# Patient Record
Sex: Female | Born: 1955 | Race: White | Hispanic: No | Marital: Married | Smoking: Current every day smoker
Health system: Southern US, Community
[De-identification: ages and names within clinical notes are randomized; demographics above are authoritative.]

## PROBLEM LIST (undated history)

## (undated) DIAGNOSIS — F419 Anxiety disorder, unspecified: Secondary | ICD-10-CM

## (undated) DIAGNOSIS — F329 Major depressive disorder, single episode, unspecified: Secondary | ICD-10-CM

## (undated) DIAGNOSIS — B1920 Unspecified viral hepatitis C without hepatic coma: Secondary | ICD-10-CM

## (undated) DIAGNOSIS — F319 Bipolar disorder, unspecified: Secondary | ICD-10-CM

## (undated) DIAGNOSIS — F32A Depression, unspecified: Secondary | ICD-10-CM

## (undated) HISTORY — PX: FOOT SURGERY: SHX648

## (undated) HISTORY — PX: ABDOMINAL HYSTERECTOMY: SHX81

---

## 1999-04-01 ENCOUNTER — Encounter: Payer: Self-pay | Admitting: Emergency Medicine

## 1999-04-01 ENCOUNTER — Emergency Department (HOSPITAL_COMMUNITY): Admission: EM | Admit: 1999-04-01 | Discharge: 1999-04-01 | Payer: Self-pay | Admitting: Emergency Medicine

## 1999-05-24 ENCOUNTER — Other Ambulatory Visit: Admission: RE | Admit: 1999-05-24 | Discharge: 1999-05-24 | Payer: Self-pay | Admitting: Obstetrics & Gynecology

## 1999-09-06 ENCOUNTER — Encounter: Admission: RE | Admit: 1999-09-06 | Discharge: 1999-11-22 | Payer: Self-pay | Admitting: Anesthesiology

## 1999-11-22 ENCOUNTER — Encounter: Admission: RE | Admit: 1999-11-22 | Discharge: 2000-01-28 | Payer: Self-pay | Admitting: Anesthesiology

## 2000-06-30 ENCOUNTER — Other Ambulatory Visit: Admission: RE | Admit: 2000-06-30 | Discharge: 2000-06-30 | Payer: Self-pay | Admitting: Obstetrics & Gynecology

## 2000-09-18 ENCOUNTER — Emergency Department (HOSPITAL_COMMUNITY): Admission: EM | Admit: 2000-09-18 | Discharge: 2000-09-18 | Payer: Self-pay | Admitting: *Deleted

## 2000-10-14 ENCOUNTER — Ambulatory Visit (HOSPITAL_COMMUNITY): Admission: RE | Admit: 2000-10-14 | Discharge: 2000-10-14 | Payer: Self-pay | Admitting: Obstetrics & Gynecology

## 2000-10-14 ENCOUNTER — Encounter (INDEPENDENT_AMBULATORY_CARE_PROVIDER_SITE_OTHER): Payer: Self-pay

## 2001-08-06 ENCOUNTER — Encounter: Admission: RE | Admit: 2001-08-06 | Discharge: 2001-08-06 | Payer: Self-pay | Admitting: Internal Medicine

## 2001-08-06 ENCOUNTER — Encounter: Payer: Self-pay | Admitting: Internal Medicine

## 2001-09-25 ENCOUNTER — Other Ambulatory Visit: Admission: RE | Admit: 2001-09-25 | Discharge: 2001-09-25 | Payer: Self-pay | Admitting: Obstetrics & Gynecology

## 2002-07-17 ENCOUNTER — Inpatient Hospital Stay (HOSPITAL_COMMUNITY): Admission: EM | Admit: 2002-07-17 | Discharge: 2002-07-22 | Payer: Self-pay | Admitting: Psychiatry

## 2002-07-23 ENCOUNTER — Other Ambulatory Visit (HOSPITAL_COMMUNITY): Admission: RE | Admit: 2002-07-23 | Discharge: 2002-08-02 | Payer: Self-pay | Admitting: Psychiatry

## 2003-04-11 ENCOUNTER — Encounter: Payer: Self-pay | Admitting: Emergency Medicine

## 2003-04-11 ENCOUNTER — Observation Stay (HOSPITAL_COMMUNITY): Admission: EM | Admit: 2003-04-11 | Discharge: 2003-04-12 | Payer: Self-pay

## 2003-04-14 ENCOUNTER — Encounter: Payer: Self-pay | Admitting: General Surgery

## 2003-04-14 ENCOUNTER — Inpatient Hospital Stay (HOSPITAL_COMMUNITY): Admission: EM | Admit: 2003-04-14 | Discharge: 2003-04-15 | Payer: Self-pay | Admitting: Emergency Medicine

## 2003-10-09 ENCOUNTER — Encounter: Admission: RE | Admit: 2003-10-09 | Discharge: 2003-11-04 | Payer: Self-pay | Admitting: Internal Medicine

## 2005-12-20 ENCOUNTER — Other Ambulatory Visit: Admission: RE | Admit: 2005-12-20 | Discharge: 2005-12-20 | Payer: Self-pay | Admitting: Obstetrics & Gynecology

## 2005-12-23 ENCOUNTER — Ambulatory Visit (HOSPITAL_COMMUNITY): Admission: RE | Admit: 2005-12-23 | Discharge: 2005-12-23 | Payer: Self-pay | Admitting: Obstetrics & Gynecology

## 2006-07-21 ENCOUNTER — Inpatient Hospital Stay (HOSPITAL_COMMUNITY): Admission: AD | Admit: 2006-07-21 | Discharge: 2006-07-25 | Payer: Self-pay | Admitting: Psychiatry

## 2006-07-21 ENCOUNTER — Ambulatory Visit: Payer: Self-pay | Admitting: Psychiatry

## 2006-12-05 ENCOUNTER — Encounter: Admission: RE | Admit: 2006-12-05 | Discharge: 2006-12-05 | Payer: Self-pay | Admitting: Obstetrics & Gynecology

## 2007-04-11 ENCOUNTER — Emergency Department (HOSPITAL_COMMUNITY): Admission: EM | Admit: 2007-04-11 | Discharge: 2007-04-11 | Payer: Self-pay | Admitting: Emergency Medicine

## 2007-05-16 ENCOUNTER — Encounter: Admission: RE | Admit: 2007-05-16 | Discharge: 2007-05-16 | Payer: Self-pay | Admitting: Gastroenterology

## 2007-05-18 ENCOUNTER — Encounter: Admission: RE | Admit: 2007-05-18 | Discharge: 2007-05-18 | Payer: Self-pay | Admitting: Gastroenterology

## 2007-09-27 ENCOUNTER — Ambulatory Visit: Payer: Self-pay | Admitting: Gastroenterology

## 2007-10-23 ENCOUNTER — Encounter: Admission: RE | Admit: 2007-10-23 | Discharge: 2007-10-23 | Payer: Self-pay | Admitting: Cardiology

## 2007-10-24 ENCOUNTER — Ambulatory Visit: Payer: Self-pay | Admitting: Oncology

## 2007-11-20 LAB — CBC WITH DIFFERENTIAL/PLATELET
BASO%: 0.6 % (ref 0.0–2.0)
Basophils Absolute: 0 10*3/uL (ref 0.0–0.1)
EOS%: 1.1 % (ref 0.0–7.0)
Eosinophils Absolute: 0.1 10*3/uL (ref 0.0–0.5)
HCT: 47.9 % — ABNORMAL HIGH (ref 34.8–46.6)
HGB: 16.7 g/dL — ABNORMAL HIGH (ref 11.6–15.9)
LYMPH%: 34.8 % (ref 14.0–48.0)
MCH: 31.4 pg (ref 26.0–34.0)
MCHC: 34.9 g/dL (ref 32.0–36.0)
MCV: 90 fL (ref 81.0–101.0)
MONO#: 0.5 10*3/uL (ref 0.1–0.9)
MONO%: 8.5 % (ref 0.0–13.0)
NEUT#: 3.3 10*3/uL (ref 1.5–6.5)
NEUT%: 55 % (ref 39.6–76.8)
Platelets: 262 10*3/uL (ref 145–400)
RBC: 5.32 10*6/uL (ref 3.70–5.32)
RDW: 12.8 % (ref 11.3–14.5)
WBC: 5.9 10*3/uL (ref 3.9–10.0)
lymph#: 2.1 10*3/uL (ref 0.9–3.3)

## 2007-11-20 LAB — CHCC SMEAR

## 2007-11-20 LAB — MORPHOLOGY
PLT EST: ADEQUATE
RBC Comments: NORMAL

## 2007-11-26 ENCOUNTER — Ambulatory Visit: Admission: RE | Admit: 2007-11-26 | Discharge: 2007-11-26 | Payer: Self-pay | Admitting: Oncology

## 2007-11-28 LAB — COMPREHENSIVE METABOLIC PANEL
ALT: 35 U/L (ref 0–35)
AST: 29 U/L (ref 0–37)
Albumin: 4.3 g/dL (ref 3.5–5.2)
Alkaline Phosphatase: 52 U/L (ref 39–117)
BUN: 8 mg/dL (ref 6–23)
CO2: 27 mEq/L (ref 19–32)
Calcium: 9.2 mg/dL (ref 8.4–10.5)
Chloride: 104 mEq/L (ref 96–112)
Creatinine, Ser: 0.59 mg/dL (ref 0.40–1.20)
Glucose, Bld: 95 mg/dL (ref 70–99)
Potassium: 3.6 mEq/L (ref 3.5–5.3)
Sodium: 141 mEq/L (ref 135–145)
Total Bilirubin: 0.4 mg/dL (ref 0.3–1.2)
Total Protein: 7.3 g/dL (ref 6.0–8.3)

## 2007-11-28 LAB — JAK2 GENOTYPR

## 2007-11-28 LAB — LACTATE DEHYDROGENASE: LDH: 116 U/L (ref 94–250)

## 2007-12-19 ENCOUNTER — Ambulatory Visit: Payer: Self-pay | Admitting: Oncology

## 2007-12-25 ENCOUNTER — Ambulatory Visit: Payer: Self-pay | Admitting: Gastroenterology

## 2007-12-25 LAB — CBC WITH DIFFERENTIAL/PLATELET
BASO%: 1.1 % (ref 0.0–2.0)
Basophils Absolute: 0.1 10*3/uL (ref 0.0–0.1)
EOS%: 0.5 % (ref 0.0–7.0)
Eosinophils Absolute: 0 10*3/uL (ref 0.0–0.5)
HCT: 46.5 % (ref 34.8–46.6)
HGB: 15.9 g/dL (ref 11.6–15.9)
LYMPH%: 42 % (ref 14.0–48.0)
MCH: 31.1 pg (ref 26.0–34.0)
MCHC: 34.2 g/dL (ref 32.0–36.0)
MCV: 90.8 fL (ref 81.0–101.0)
MONO#: 0.3 10*3/uL (ref 0.1–0.9)
MONO%: 5.7 % (ref 0.0–13.0)
NEUT#: 3 10*3/uL (ref 1.5–6.5)
NEUT%: 50.7 % (ref 39.6–76.8)
Platelets: 199 10*3/uL (ref 145–400)
RBC: 5.12 10*6/uL (ref 3.70–5.32)
RDW: 12.9 % (ref 11.3–14.5)
WBC: 5.9 10*3/uL (ref 3.9–10.0)
lymph#: 2.5 10*3/uL (ref 0.9–3.3)

## 2008-01-01 ENCOUNTER — Encounter (INDEPENDENT_AMBULATORY_CARE_PROVIDER_SITE_OTHER): Payer: Self-pay | Admitting: Interventional Radiology

## 2008-01-01 ENCOUNTER — Ambulatory Visit (HOSPITAL_COMMUNITY): Admission: RE | Admit: 2008-01-01 | Discharge: 2008-01-01 | Payer: Self-pay | Admitting: Gastroenterology

## 2008-03-20 ENCOUNTER — Ambulatory Visit: Payer: Self-pay | Admitting: Gastroenterology

## 2008-03-21 ENCOUNTER — Ambulatory Visit: Payer: Self-pay | Admitting: Oncology

## 2008-04-10 ENCOUNTER — Ambulatory Visit (HOSPITAL_BASED_OUTPATIENT_CLINIC_OR_DEPARTMENT_OTHER): Admission: RE | Admit: 2008-04-10 | Discharge: 2008-04-10 | Payer: Self-pay | Admitting: Family Medicine

## 2008-04-15 ENCOUNTER — Ambulatory Visit (HOSPITAL_COMMUNITY): Admission: RE | Admit: 2008-04-15 | Discharge: 2008-04-15 | Payer: Self-pay | Admitting: Family Medicine

## 2008-04-20 IMAGING — US US BIOPSY
1 series · 10 of 10 positions shown · non-contrast
Comparison: none

CLINICAL DATA: Hepatitis C; request is made for random core liver biopsy.
ULTRASOUND-GUIDED RANDOM CORE LIVER BIOPSY ? 01/01/08:

[Series 1: unknown · 0.28mm/px · 10 of 10 slices shown]
[im 1/10]
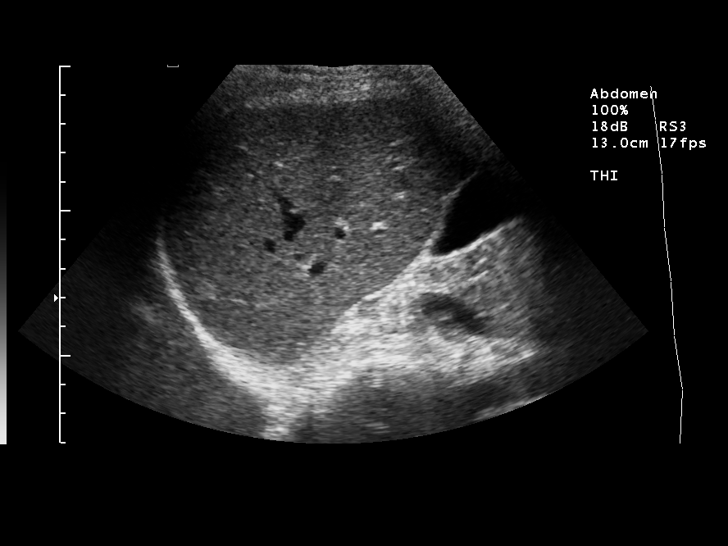
[im 2/10]
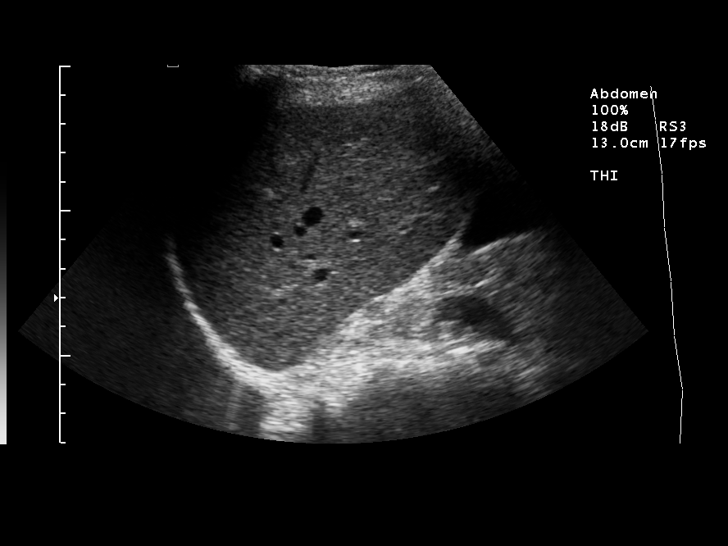
[im 3/10]
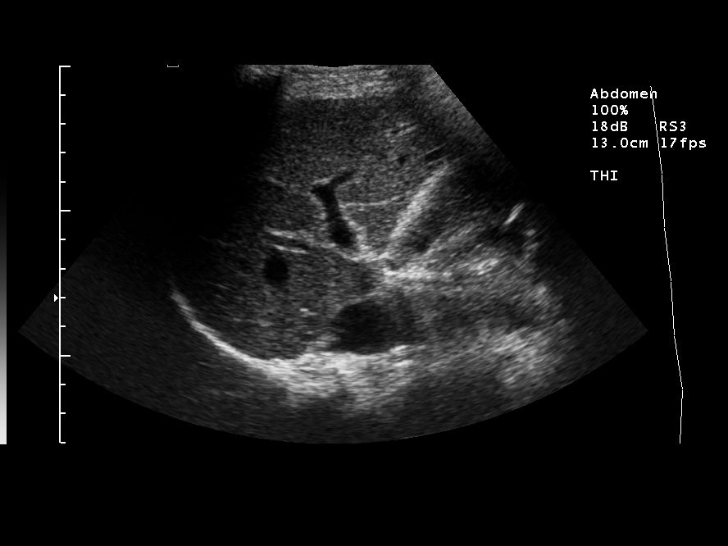
[im 4/10]
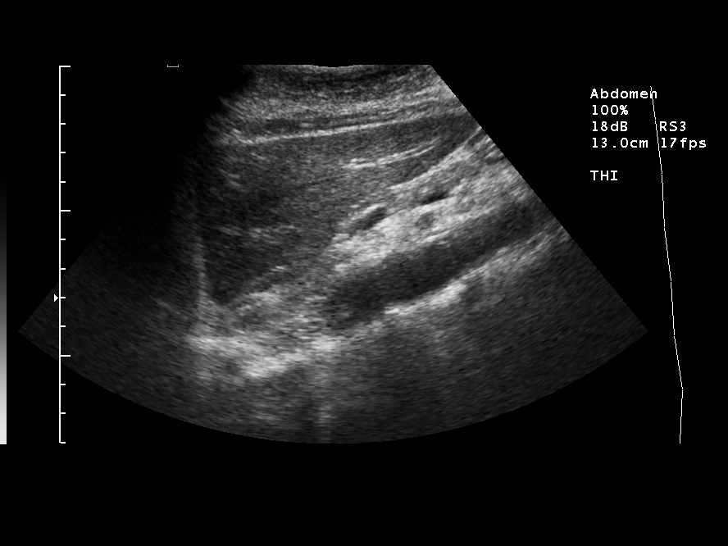
[im 5/10]
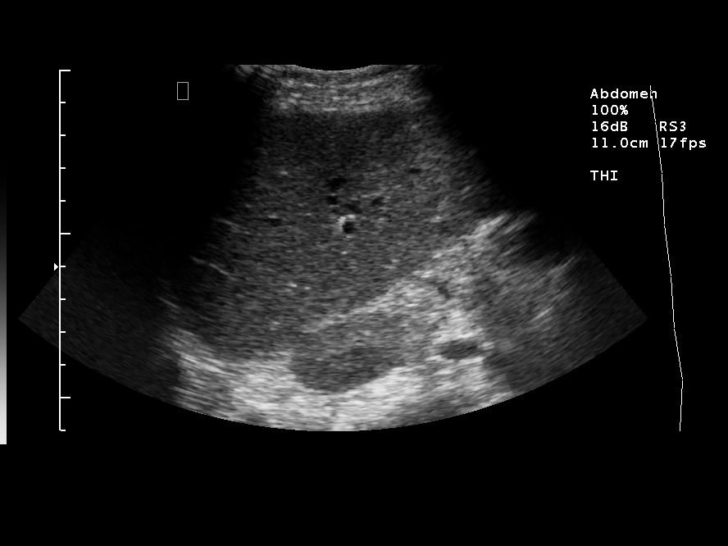
[im 6/10]
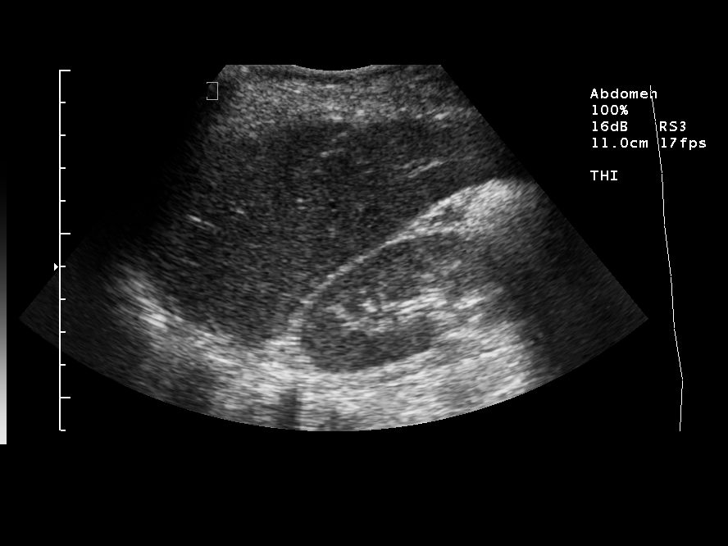
[im 7/10]
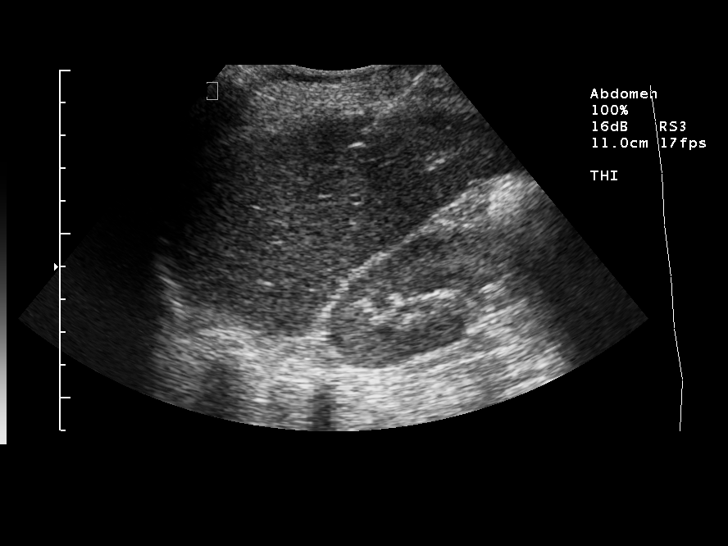
[im 8/10]
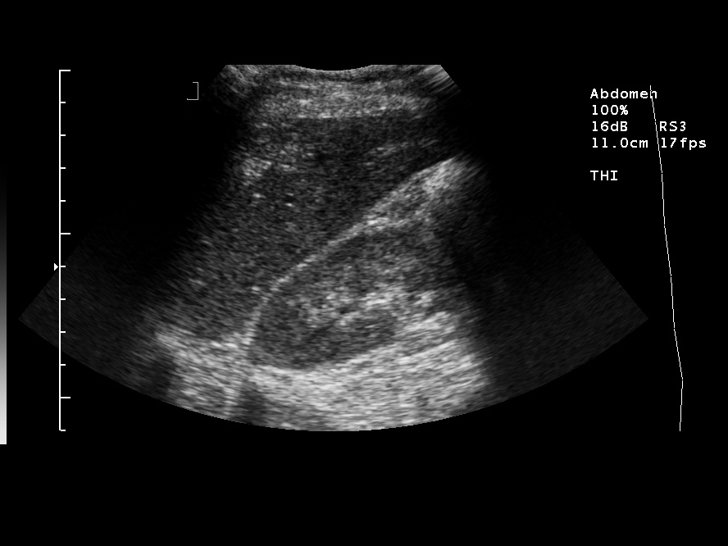
[im 9/10]
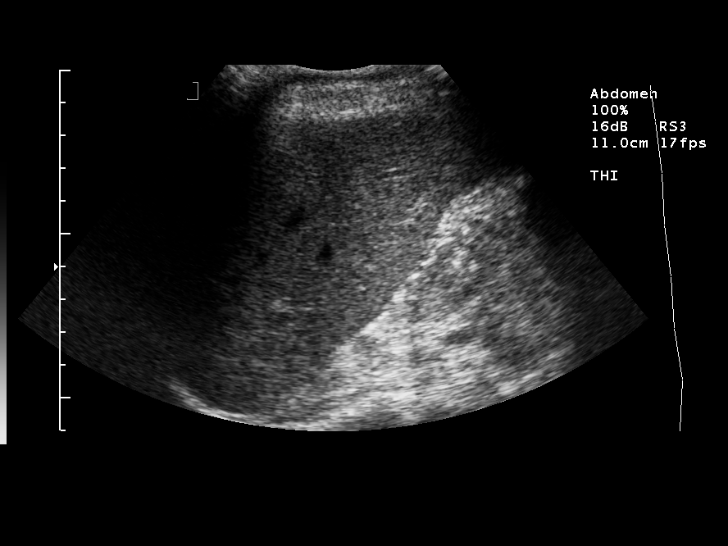
[im 10/10]
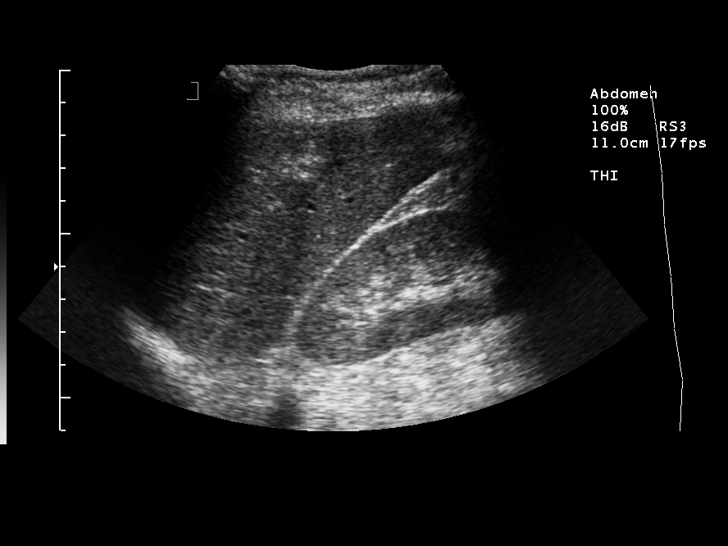

[10 of 10 positions shown; findings below may reference images not displayed]

FINDINGS: An ultrasound-guided random core liver biopsy was thoroughly discussed with the patient and questions were answered.  The benefits, risks, alternatives, and complications were also discussed.  The patient understands and wishes to proceed with the procedure.  Verbal as well as written consent was obtained.
Under ultrasound guidance an appropriate skin site was marked.  The patient was then prepped and draped in the normal sterile fashion.  1% lidocaine was used for local anesthesia.  Through a 17 gauge guiding trocar, three passes were then made into the right hepatic lobe with an 18 gauge biopsy gun.  Ultrasound imaging confirmed appropriate needle placement in the liver parenchyma.  Specimens were sent to pathology for further evaluation.  The patient tolerated the procedure well and there were no immediate complications.
Medications Utilized:  Versed 2 mg IV, fentanyl 100 mcg IV.
Total Time of Sedation: 15 minutes.
The procedure was performed under the supervision of Dr. Dariusz Board.
IMPRESSION: Successful ultrasound-guided random core liver biopsy of the right hepatic lobe as discussed above.

## 2008-06-12 ENCOUNTER — Ambulatory Visit: Payer: Self-pay | Admitting: Gastroenterology

## 2008-06-19 ENCOUNTER — Ambulatory Visit: Payer: Self-pay | Admitting: Gastroenterology

## 2008-06-26 ENCOUNTER — Ambulatory Visit: Payer: Self-pay | Admitting: Gastroenterology

## 2008-07-10 ENCOUNTER — Ambulatory Visit: Payer: Self-pay | Admitting: Gastroenterology

## 2008-08-05 ENCOUNTER — Ambulatory Visit: Payer: Self-pay | Admitting: Gastroenterology

## 2008-08-21 ENCOUNTER — Ambulatory Visit: Payer: Self-pay | Admitting: Gastroenterology

## 2008-09-02 ENCOUNTER — Ambulatory Visit: Payer: Self-pay | Admitting: Gastroenterology

## 2008-09-30 ENCOUNTER — Ambulatory Visit: Payer: Self-pay | Admitting: Gastroenterology

## 2008-11-04 ENCOUNTER — Ambulatory Visit: Payer: Self-pay | Admitting: Gastroenterology

## 2008-12-02 ENCOUNTER — Ambulatory Visit: Payer: Self-pay | Admitting: Gastroenterology

## 2008-12-30 ENCOUNTER — Ambulatory Visit: Payer: Self-pay | Admitting: Gastroenterology

## 2009-01-27 ENCOUNTER — Ambulatory Visit: Payer: Self-pay | Admitting: Gastroenterology

## 2009-03-03 ENCOUNTER — Ambulatory Visit: Payer: Self-pay | Admitting: Gastroenterology

## 2009-03-26 ENCOUNTER — Ambulatory Visit: Payer: Self-pay | Admitting: Gastroenterology

## 2009-04-23 ENCOUNTER — Ambulatory Visit: Payer: Self-pay | Admitting: Gastroenterology

## 2009-05-08 ENCOUNTER — Ambulatory Visit (HOSPITAL_COMMUNITY): Admission: RE | Admit: 2009-05-08 | Discharge: 2009-05-08 | Payer: Self-pay | Admitting: Gastroenterology

## 2009-05-19 ENCOUNTER — Ambulatory Visit (HOSPITAL_COMMUNITY): Admission: RE | Admit: 2009-05-19 | Discharge: 2009-05-19 | Payer: Self-pay | Admitting: Gastroenterology

## 2009-05-21 ENCOUNTER — Ambulatory Visit: Payer: Self-pay | Admitting: Gastroenterology

## 2009-11-11 ENCOUNTER — Ambulatory Visit: Payer: Self-pay | Admitting: Diagnostic Radiology

## 2009-11-11 ENCOUNTER — Emergency Department (HOSPITAL_BASED_OUTPATIENT_CLINIC_OR_DEPARTMENT_OTHER): Admission: EM | Admit: 2009-11-11 | Discharge: 2009-11-11 | Payer: Self-pay | Admitting: Emergency Medicine

## 2010-03-18 ENCOUNTER — Ambulatory Visit: Payer: Self-pay | Admitting: Gastroenterology

## 2011-01-23 LAB — POCT CARDIAC MARKERS
CKMB, poc: <1 ng/mL — ABNORMAL LOW (ref 1.0–8.0)
Myoglobin, poc: 27.4 ng/mL (ref 12–200)
Troponin i, poc: <0.05 ng/mL (ref 0.00–0.09)

## 2011-01-23 LAB — DIFFERENTIAL
Basophils Absolute: 0 10*3/uL (ref 0.0–0.1)
Basophils Relative: 1 % (ref 0–1)
Eosinophils Absolute: 0 10*3/uL (ref 0.0–0.7)
Eosinophils Relative: 1 % (ref 0–5)
Lymphocytes Relative: 38 % (ref 12–46)
Lymphs Abs: 2.2 10*3/uL (ref 0.7–4.0)
Monocytes Absolute: 0.4 10*3/uL (ref 0.1–1.0)
Monocytes Relative: 7 % (ref 3–12)
Neutro Abs: 3.3 10*3/uL (ref 1.7–7.7)
Neutrophils Relative %: 54 % (ref 43–77)

## 2011-01-23 LAB — CBC
HCT: 39.7 % (ref 36.0–46.0)
Hemoglobin: 13.7 g/dL (ref 12.0–15.0)
MCHC: 34.5 g/dL (ref 30.0–36.0)
MCV: 93.3 fL (ref 78.0–100.0)
Platelets: 291 10*3/uL (ref 150–400)
RBC: 4.26 MIL/uL (ref 3.87–5.11)
RDW: 12.5 % (ref 11.5–15.5)
WBC: 5.9 10*3/uL (ref 4.0–10.5)

## 2011-01-23 LAB — BASIC METABOLIC PANEL
BUN: 12 mg/dL (ref 6–23)
CO2: 29 mEq/L (ref 19–32)
Calcium: 9.2 mg/dL (ref 8.4–10.5)
Chloride: 106 mEq/L (ref 96–112)
Creatinine, Ser: 0.6 mg/dL (ref 0.4–1.2)
GFR calc Af Amer: >60 mL/min (ref >60)
GFR calc non Af Amer: >60 mL/min (ref >60)
Glucose, Bld: 119 mg/dL — ABNORMAL HIGH (ref 70–99)
Potassium: 3.7 mEq/L (ref 3.5–5.1)
Sodium: 142 mEq/L (ref 135–145)

## 2011-03-17 ENCOUNTER — Emergency Department (HOSPITAL_BASED_OUTPATIENT_CLINIC_OR_DEPARTMENT_OTHER)
Admission: EM | Admit: 2011-03-17 | Discharge: 2011-03-17 | Disposition: A | Discharge status: Home / Self Care | Payer: BC Managed Care – PPO | Attending: Emergency Medicine | Admitting: Emergency Medicine

## 2011-03-17 DIAGNOSIS — F411 Generalized anxiety disorder: Secondary | ICD-10-CM | POA: Insufficient documentation

## 2011-03-17 DIAGNOSIS — F988 Other specified behavioral and emotional disorders with onset usually occurring in childhood and adolescence: Secondary | ICD-10-CM | POA: Insufficient documentation
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
## 2011-03-25 NOTE — Op Note (Signed)
[facility]  Patient:    Brandi Vazquez, Brandi Vazquez                       MRN: [id number] Proc. Date: 10/14/00 Adm. Date:  [id number] Disc. Date: [id number] Attending:  Ephriam Knuckles H                           Operative Report  PREOPERATIVE DIAGNOSIS:  Persistent complex left adnexal mass, persistent pelvic pain.  POSTOPERATIVE DIAGNOSIS:  Persistent complex left adnexal mass, persistent pelvic pain, with complex left ovarian cyst most consistent with a hemorrhagic cyst without evidence of malignancy by inspection and extensive intraperitoneal adhesions.  OPERATIVE PROCEDURE:  Laparoscopy, lysis of adhesions, and left salpingo-oophorectomy.  SURGEON:  Freddy Finner, M.D.  ANESTHESIA:  General endotracheal.  INTRAOPERATIVE COMPLICATIONS:  None.  ESTIMATED INTRAOPERATIVE BLOOD LOSS:  Less than or equal to 20 cc.  INDICATIONS:  Patient is a 55 year old white female who has previously had a vaginal hysterectomy.  Since September of this year, she has had chronic left pelvic pain with recent marked exacerbation of her pain.  She has had persistence of her left ovarian cyst which is complex in nature and evolving with a change in recent ultrasound.  Due to the severe nature of her pain and the persistence of her symptom, she is now admitted for laparoscopy, possible bilateral salpingo-oophorectomy.  The patient and I carefully discussed the options and arrived at the understanding that, if the right ovary was normal, it would be left in place for now so that she would not have to start hormone replacement therapy.  She has reviewed a video in the office describing laparoscopy and the potential risks of the procedure and is ready to proceed.  Intraoperative findings are recorded in still photographs which were retained in the office record.  They consisted of extensive omental adhesions to structures in the pelvis including a right lower quadrant and pelvic  brim, anterior abdominal wall, right ovary, vaginal cuff.  The right ovary was normal with evidence of a recent corpus luteum.  The left ovary was enlarged and cystic but smooth and not fixed to any structures with no external excrescences or obvious abnormalities.  The appendix was normal.  There was no other apparent abnormality noted.  DESCRIPTION OF PROCEDURE:  Patient was admitted on the morning of surgery, brought to the operating room and, there, placed under adequate general endotracheal anesthesia and placed in the dorsal lithotomy position using the [facility] stirrup system.  Betadine prep was carried out.  Bladder was evacuated with sterile technique.  Sponge forceps loaded with sponges in the tip was placed in the vagina for potential use during the procedure.  Sterile drapes were applied.  Two small incisions were made, one at the umbilicus and one just above the symphysis.  A 12 mm disposable trocar was introduced at the umbilicus while elevating the anterior abdominal wall manually.  Direct inspection revealed adequate placement with no evidence of injury on entry. Pneumoperitoneum was allowed to accumulate with carbon dioxide gas.  Using bipolar coagulation and sharp division of the omental adhesions to the anterior abdominal wall and right lower quadrant and single, band-like adhesions in the right ovary were fulgurated and lysed.  Lysis of adhesions on the left was also carried out.  A 5 mm trocar was then placed through the lower incision and, through it, grasping forceps used to elevate the ovaries  to inspect and treat the right and to remove the left.  With progressive bites, bipolar fulguration and sharp division of the infundibulopelvic and remaining broad ligament was carried out on the left completely freeing the left tube and ovary.  The ovary was morcellated using sharp dissection and retrieved through the umbilical port.  A [place] irrigation system was used  to irrigate the pelvis and to confirm complete hemostasis.  The irrigating solution was aspirated from the abdomen and, with complete hemostasis, the procedure was terminated.  Gas was allowed to escape from the abdomen.  Plain Marcaine 0.25% was injected into the incision sites for postoperative analgesia.  The incisions were closed with interrupted subcuticular sutures of 3-0 Dexon.  Steri-Strips were applied to the lower incision.  The patient was awakened and taken to recovery in good condition.  The plan is to discharge her with routine outpatient surgical instructions, to avoid heavy lifting or vaginal entry for at least a week.  She has hydrocodone to be taken as needed for pain.  She is to return to the office in 10 to 14 days for postoperative follow-up. DD:  10/14/00 TD:  10/14/00 Job: [id number]
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
## 2011-07-29 LAB — BLOOD GAS, ARTERIAL
Acid-Base Excess: 1.8
Bicarbonate: 25.8 — ABNORMAL HIGH
Drawn by: 27931
FIO2: 0.21
O2 Saturation: 96.1
Patient temperature: 98.6
TCO2: 22.1
pCO2 arterial: 40
pH, Arterial: 7.425 — ABNORMAL HIGH
pO2, Arterial: 76.6 — ABNORMAL LOW

## 2011-07-29 LAB — CBC
HCT: 47 — ABNORMAL HIGH
Hemoglobin: 16 — ABNORMAL HIGH
MCHC: 34
MCV: 92.2
Platelets: 204
RBC: 5.09
RDW: 12.8
WBC: 5.5

## 2011-07-29 LAB — PROTIME-INR
INR: 1
Prothrombin Time: 13

## 2011-07-29 LAB — CARBOXYHEMOGLOBIN
Carboxyhemoglobin: 5.1
Methemoglobin: 1.2
O2 Saturation: 96.1
Total hemoglobin: 15.5

## 2011-08-25 LAB — I-STAT 8, (EC8 V) (CONVERTED LAB)
Acid-base deficit: 1
BUN: 10
Bicarbonate: 22.1
Chloride: 108
Glucose, Bld: 88
HCT: 51 — ABNORMAL HIGH
Hemoglobin: 17.3 — ABNORMAL HIGH
Operator id: 234501
Potassium: 3.7
Sodium: 140
TCO2: 23
pCO2, Ven: 31.5 — ABNORMAL LOW
pH, Ven: 7.453 — ABNORMAL HIGH

## 2011-08-25 LAB — HEPATIC FUNCTION PANEL
ALT: 97 — ABNORMAL HIGH
AST: 75 — ABNORMAL HIGH
Albumin: 3.9
Alkaline Phosphatase: 70
Bilirubin, Direct: 0.2
Indirect Bilirubin: 0.7
Total Bilirubin: 0.9
Total Protein: 7

## 2011-08-25 LAB — URINALYSIS, ROUTINE W REFLEX MICROSCOPIC
Bilirubin Urine: NEGATIVE
Glucose, UA: NEGATIVE
Hgb urine dipstick: NEGATIVE
Ketones, ur: NEGATIVE
Nitrite: POSITIVE — AB
Protein, ur: NEGATIVE
Specific Gravity, Urine: 1.022
Urobilinogen, UA: 1
pH: 6

## 2011-08-25 LAB — CBC
HCT: 47.4 — ABNORMAL HIGH
Hemoglobin: 16 — ABNORMAL HIGH
MCHC: 33.8
MCV: 91
Platelets: 248
RBC: 5.21 — ABNORMAL HIGH
RDW: 13.1
WBC: 11.5 — ABNORMAL HIGH

## 2011-08-25 LAB — DIFFERENTIAL
Basophils Absolute: 0.1
Basophils Relative: 1
Eosinophils Absolute: 0
Eosinophils Relative: 0
Lymphocytes Relative: 24
Lymphs Abs: 2.7
Monocytes Absolute: 0.8 — ABNORMAL HIGH
Monocytes Relative: 7
Neutro Abs: 7.9 — ABNORMAL HIGH
Neutrophils Relative %: 69

## 2011-08-25 LAB — URINE MICROSCOPIC-ADD ON

## 2011-08-25 LAB — URINE CULTURE: Colony Count: >=100000

## 2011-08-25 LAB — POCT I-STAT CREATININE
Creatinine, Ser: 0.7
Operator id: 234501

## 2011-08-25 LAB — LIPASE, BLOOD: Lipase: 36

## 2012-06-05 ENCOUNTER — Emergency Department (HOSPITAL_COMMUNITY)
Admission: EM | Admit: 2012-06-05 | Discharge: 2012-06-06 | Disposition: A | Discharge status: Home / Self Care | Payer: BC Managed Care – PPO | Attending: Emergency Medicine | Admitting: Emergency Medicine

## 2012-06-05 ENCOUNTER — Encounter (HOSPITAL_COMMUNITY): Payer: Self-pay | Admitting: *Deleted

## 2012-06-05 DIAGNOSIS — F341 Dysthymic disorder: Secondary | ICD-10-CM | POA: Insufficient documentation

## 2012-06-05 DIAGNOSIS — F329 Major depressive disorder, single episode, unspecified: Secondary | ICD-10-CM

## 2012-06-05 DIAGNOSIS — R45851 Suicidal ideations: Secondary | ICD-10-CM | POA: Insufficient documentation

## 2012-06-05 DIAGNOSIS — Z046 Encounter for general psychiatric examination, requested by authority: Secondary | ICD-10-CM | POA: Insufficient documentation

## 2012-06-05 DIAGNOSIS — F32A Depression, unspecified: Secondary | ICD-10-CM

## 2012-06-05 HISTORY — DX: Depression, unspecified: F32.A

## 2012-06-05 HISTORY — DX: Major depressive disorder, single episode, unspecified: F32.9

## 2012-06-05 HISTORY — DX: Anxiety disorder, unspecified: F41.9

## 2012-06-05 HISTORY — DX: Bipolar disorder, unspecified: F31.9

## 2012-06-05 LAB — CBC
HCT: 43 % (ref 36.0–46.0)
Hemoglobin: 14.5 g/dL (ref 12.0–15.0)
MCH: 31 pg (ref 26.0–34.0)
MCHC: 33.7 g/dL (ref 30.0–36.0)
MCV: 91.9 fL (ref 78.0–100.0)
Platelets: 204 10*3/uL (ref 150–400)
RBC: 4.68 MIL/uL (ref 3.87–5.11)
RDW: 12.6 % (ref 11.5–15.5)
WBC: 5.1 10*3/uL (ref 4.0–10.5)

## 2012-06-05 LAB — COMPREHENSIVE METABOLIC PANEL
ALT: 42 U/L — ABNORMAL HIGH (ref 0–35)
AST: 43 U/L — ABNORMAL HIGH (ref 0–37)
Albumin: 4 g/dL (ref 3.5–5.2)
Alkaline Phosphatase: 53 U/L (ref 39–117)
BUN: 12 mg/dL (ref 6–23)
CO2: 28 mEq/L (ref 19–32)
Calcium: 9.6 mg/dL (ref 8.4–10.5)
Chloride: 102 mEq/L (ref 96–112)
Creatinine, Ser: 0.87 mg/dL (ref 0.50–1.10)
GFR calc Af Amer: 86 mL/min — ABNORMAL LOW (ref >90)
GFR calc non Af Amer: 74 mL/min — ABNORMAL LOW (ref >90)
Glucose, Bld: 82 mg/dL (ref 70–99)
Potassium: 3.8 mEq/L (ref 3.5–5.1)
Sodium: 139 mEq/L (ref 135–145)
Total Bilirubin: 0.3 mg/dL (ref 0.3–1.2)
Total Protein: 6.9 g/dL (ref 6.0–8.3)

## 2012-06-05 LAB — RAPID URINE DRUG SCREEN, HOSP PERFORMED
Amphetamines: NOT DETECTED
Barbiturates: NOT DETECTED
Benzodiazepines: NOT DETECTED
Cocaine: NOT DETECTED
Opiates: NOT DETECTED
Tetrahydrocannabinol: POSITIVE — AB

## 2012-06-05 LAB — ETHANOL: Alcohol, Ethyl (B): <11 mg/dL (ref 0–11)

## 2012-06-05 MED ORDER — CLONAZEPAM 1 MG PO TABS
1.0000 mg | ORAL_TABLET | Freq: Two times a day (BID) | ORAL | Status: DC
  Administered 2012-06-05 – 2012-06-06 (×2): 1 mg via ORAL
  Filled 2012-06-05 (×2): qty 1

## 2012-06-05 MED ORDER — CITALOPRAM HYDROBROMIDE 40 MG PO TABS
40.0000 mg | ORAL_TABLET | Freq: Every day | ORAL | Status: DC
  Administered 2012-06-06: 40 mg via ORAL
  Filled 2012-06-05: qty 1

## 2012-06-05 MED ORDER — BUPROPION HCL ER (XL) 150 MG PO TB24
150.0000 mg | ORAL_TABLET | Freq: Every day | ORAL | Status: DC
  Administered 2012-06-06: 150 mg via ORAL
  Filled 2012-06-05: qty 1

## 2012-06-05 MED ORDER — NICOTINE 21 MG/24HR TD PT24: 21.0000 mg | MEDICATED_PATCH | Freq: Every day | TRANSDERMAL | Status: DC

## 2012-06-05 MED ORDER — ACETAMINOPHEN 325 MG PO TABS: 650.0000 mg | ORAL_TABLET | ORAL | Status: DC | PRN

## 2012-06-05 MED ORDER — IBUPROFEN 600 MG PO TABS: 600.0000 mg | ORAL_TABLET | Freq: Three times a day (TID) | ORAL | Status: DC | PRN

## 2012-06-05 MED ORDER — ARIPIPRAZOLE 10 MG PO TABS
10.0000 mg | ORAL_TABLET | Freq: Every day | ORAL | Status: DC
  Filled 2012-06-05: qty 1

## 2012-06-05 MED ORDER — ALUM & MAG HYDROXIDE-SIMETH 200-200-20 MG/5ML PO SUSP: 30.0000 mL | ORAL | Status: DC | PRN

## 2012-06-05 MED ORDER — ZOLPIDEM TARTRATE 5 MG PO TABS: 5.0000 mg | ORAL_TABLET | Freq: Every evening | ORAL | Status: DC | PRN

## 2012-06-05 MED ORDER — ONDANSETRON HCL 4 MG PO TABS: 4.0000 mg | ORAL_TABLET | Freq: Three times a day (TID) | ORAL | Status: DC | PRN

## 2012-06-05 NOTE — ED Notes (Signed)
Pt states she is depressed and felt suicidal earlier due to harrassment at work.  Pt denies HI.
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
## 2012-06-05 NOTE — ED Notes (Signed)
Pt and belongings wanded by security. Belongings behind nurses station

## 2012-06-05 NOTE — ED Notes (Signed)
One pt belongings bag locked in [place] [address].

## 2012-06-06 MED ORDER — QUETIAPINE FUMARATE ER 400 MG PO TB24
400.0000 mg | ORAL_TABLET | Freq: Every day | ORAL | Status: DC
  Filled 2012-06-06: qty 1

## 2012-06-06 NOTE — ED Provider Notes (Addendum)
Pt states she is doing fine this am.  Awaiting psych dispo.  Filed Vitals:   06/06/12 0518  BP: 123/75  Pulse: 65  Temp: 98 F (36.7 C)  Resp: 18     Celene Kras, MD 06/06/12 [phone]  Pt denies SI or HI this am.  She contracts for safety.  She would like to go home.  Her therapist has been contacted in order to arrange close outpt follow up.  Pt feels ready for discharge.  Celene Kras, MD 06/06/12 [phone]

## 2012-06-06 NOTE — [facility] Counselor (Signed)
Discussed with EDP about pt no longer endorsing SI and being able to contract for safety. Agreed that telepsych was not necessary and to confirm with pt's current providers for follow up treatment.  pt about speaking her with counselor to confirm follow up visit. Pt stated her therapist is Abel Presto and her NP is Eulah Pont at [facility]. Pt signed release and stated her next session with Lupita Leash is on 8/2. TC with Dona [phone] ext [phone]). Left message to confirm that pt was in the ED and for her to call back to confirm next scheduled appointment.   Did confirm with reception that she has appt 8/1 at 4pm with Lupita Leash. Next appt with Misty Stanley is 9/23.

## 2012-06-06 NOTE — ED Provider Notes (Signed)
Medical screening examination/treatment/procedure(s) were performed by non-physician practitioner and as supervising physician I was immediately available for consultation/collaboration.  Tywana Robotham, MD 06/06/12 0012 
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
## 2012-10-18 ENCOUNTER — Emergency Department (HOSPITAL_BASED_OUTPATIENT_CLINIC_OR_DEPARTMENT_OTHER)
Admission: EM | Admit: 2012-10-18 | Discharge: 2012-10-18 | Disposition: A | Discharge status: Home / Self Care | Payer: BC Managed Care – PPO | Attending: Emergency Medicine | Admitting: Emergency Medicine

## 2012-10-18 ENCOUNTER — Other Ambulatory Visit: Payer: Self-pay | Admitting: Gastroenterology

## 2012-10-18 ENCOUNTER — Encounter (HOSPITAL_BASED_OUTPATIENT_CLINIC_OR_DEPARTMENT_OTHER): Payer: Self-pay

## 2012-10-18 DIAGNOSIS — N390 Urinary tract infection, site not specified: Secondary | ICD-10-CM | POA: Insufficient documentation

## 2012-10-18 DIAGNOSIS — R899 Unspecified abnormal finding in specimens from other organs, systems and tissues: Secondary | ICD-10-CM

## 2012-10-18 DIAGNOSIS — Z79899 Other long term (current) drug therapy: Secondary | ICD-10-CM | POA: Insufficient documentation

## 2012-10-18 DIAGNOSIS — B182 Chronic viral hepatitis C: Secondary | ICD-10-CM

## 2012-10-18 DIAGNOSIS — M79609 Pain in unspecified limb: Secondary | ICD-10-CM | POA: Insufficient documentation

## 2012-10-18 DIAGNOSIS — F319 Bipolar disorder, unspecified: Secondary | ICD-10-CM | POA: Insufficient documentation

## 2012-10-18 DIAGNOSIS — R6889 Other general symptoms and signs: Secondary | ICD-10-CM | POA: Insufficient documentation

## 2012-10-18 DIAGNOSIS — F3289 Other specified depressive episodes: Secondary | ICD-10-CM | POA: Insufficient documentation

## 2012-10-18 DIAGNOSIS — F411 Generalized anxiety disorder: Secondary | ICD-10-CM | POA: Insufficient documentation

## 2012-10-18 DIAGNOSIS — Z8619 Personal history of other infectious and parasitic diseases: Secondary | ICD-10-CM | POA: Insufficient documentation

## 2012-10-18 DIAGNOSIS — R110 Nausea: Secondary | ICD-10-CM | POA: Insufficient documentation

## 2012-10-18 DIAGNOSIS — R197 Diarrhea, unspecified: Secondary | ICD-10-CM | POA: Insufficient documentation

## 2012-10-18 DIAGNOSIS — K529 Noninfective gastroenteritis and colitis, unspecified: Secondary | ICD-10-CM

## 2012-10-18 DIAGNOSIS — F17200 Nicotine dependence, unspecified, uncomplicated: Secondary | ICD-10-CM | POA: Insufficient documentation

## 2012-10-18 DIAGNOSIS — R51 Headache: Secondary | ICD-10-CM | POA: Insufficient documentation

## 2012-10-18 DIAGNOSIS — F329 Major depressive disorder, single episode, unspecified: Secondary | ICD-10-CM | POA: Insufficient documentation

## 2012-10-18 HISTORY — DX: Unspecified viral hepatitis C without hepatic coma: B19.20

## 2012-10-18 LAB — COMPREHENSIVE METABOLIC PANEL
ALT: 43 U/L — ABNORMAL HIGH (ref 0–35)
AST: 44 U/L — ABNORMAL HIGH (ref 0–37)
Albumin: 3.7 g/dL (ref 3.5–5.2)
Alkaline Phosphatase: 67 U/L (ref 39–117)
BUN: 10 mg/dL (ref 6–23)
CO2: 28 mEq/L (ref 19–32)
Calcium: 9.4 mg/dL (ref 8.4–10.5)
Chloride: 105 mEq/L (ref 96–112)
Creatinine, Ser: 0.7 mg/dL (ref 0.50–1.10)
GFR calc Af Amer: >90 mL/min (ref >90)
GFR calc non Af Amer: >90 mL/min (ref >90)
Glucose, Bld: 89 mg/dL (ref 70–99)
Potassium: 3.7 mEq/L (ref 3.5–5.1)
Sodium: 142 mEq/L (ref 135–145)
Total Bilirubin: 0.2 mg/dL — ABNORMAL LOW (ref 0.3–1.2)
Total Protein: 7.1 g/dL (ref 6.0–8.3)

## 2012-10-18 LAB — URINALYSIS, ROUTINE W REFLEX MICROSCOPIC
Bilirubin Urine: NEGATIVE
Glucose, UA: NEGATIVE mg/dL
Hgb urine dipstick: NEGATIVE
Ketones, ur: 15 mg/dL — AB
Nitrite: POSITIVE — AB
Protein, ur: NEGATIVE mg/dL
Specific Gravity, Urine: 1.022 (ref 1.005–1.030)
Urobilinogen, UA: 1 mg/dL (ref 0.0–1.0)
pH: 6 (ref 5.0–8.0)

## 2012-10-18 LAB — URINE MICROSCOPIC-ADD ON

## 2012-10-18 MED ORDER — IBUPROFEN 400 MG PO TABS
600.0000 mg | ORAL_TABLET | Freq: Once | ORAL | Status: AC
  Administered 2012-10-18: 600 mg via ORAL
  Filled 2012-10-18: qty 1

## 2012-10-18 MED ORDER — NITROFURANTOIN MONOHYD MACRO 100 MG PO CAPS: 100.0000 mg | ORAL_CAPSULE | Freq: Two times a day (BID) | ORAL | Status: DC

## 2012-10-18 MED ORDER — ONDANSETRON HCL 4 MG/2ML IJ SOLN
4.0000 mg | Freq: Once | INTRAMUSCULAR | Status: AC
  Administered 2012-10-18: 4 mg via INTRAVENOUS
  Filled 2012-10-18: qty 2

## 2012-10-18 MED ORDER — LACTATED RINGERS IV BOLUS (SEPSIS)
1000.0000 mL | Freq: Once | INTRAVENOUS | Status: AC
  Administered 2012-10-18: 1000 mL via INTRAVENOUS

## 2012-10-18 NOTE — ED Notes (Signed)
Pt called with abnormal lab values and told to come to ED for further evaluation.

## 2012-10-18 NOTE — ED Provider Notes (Signed)
History     CSN: [id number]  Arrival date & time 10/18/12  1819   First MD Initiated Contact with Patient 10/18/12 1847      Chief Complaint  Patient presents with  . hyperkalemia   . abnormal labs     (Consider location/radiation/quality/duration/timing/severity/associated sxs/prior treatment) HPI Brandi Vazquez is a 56 y.o. female presents with 3 month history of worsening diarrhea, she was referred here by the laboratory call that said that she had high potassium and should be referred to the emergency department. Patient's was seen today by her primary care physician Dr. Bosie Clos who started a workup for her chronic diarrhea. Patient also has a history of hepatitis C. She has had abdominal pain the past few weeks but does not complain about any pain now. She complains about a mild headache which she's had intermittently over the last few days as well as some mild nausea.  She has not vomited, she's had multiple episodes of diarrhea today without any blood. Patient also complains about some intermittent calf cramping that started after this diarrhea started about 3 months ago. No fevers, no chills, no shortness of breath, no chest pain. Patient has been able to keep foods down and fluids down has been rehydrating with Pepsi. She denies dysuria, vaginal complaints but does endorse frequency.   Past Medical History  Diagnosis Date  . Anxiety   . Depression   . Bipolar 1 disorder   . Hepatitis C     Past Surgical History  Procedure Date  . Abdominal hysterectomy   . Foot surgery     No family history on file.  History  Substance Use Topics  . Smoking status: Current Every Day Smoker -- 0.5 packs/day    Types: Cigarettes  . Smokeless tobacco: Not on file  . Alcohol Use: Yes     Comment: occasionally    OB History    Grav Para Term Preterm Abortions TAB SAB Ect Mult Living                  Review of Systems At least 10pt or greater review of systems completed and are  negative except where specified in the HPI.  Allergies  Review of patient's allergies indicates no known allergies.  Home Medications   Current Outpatient Rx  Name  Route  Sig  Dispense  Refill  . ARIPIPRAZOLE 10 MG PO TABS   Oral   Take 10 mg by mouth at bedtime.         . BUPROPION HCL ER (XL) 150 MG PO TB24   Oral   Take 150 mg by mouth daily.         Marland Kitchen CITALOPRAM HYDROBROMIDE 40 MG PO TABS   Oral   Take 40 mg by mouth daily.         Marland Kitchen CLONAZEPAM 1 MG PO TABS   Oral   Take 1 mg by mouth 2 (two) times daily.         . QUETIAPINE FUMARATE ER 400 MG PO TB24   Oral   Take 400 mg by mouth at bedtime.         Marland Kitchen ZOLPIDEM TARTRATE ER 12.5 MG PO TBCR   Oral   Take 12.5 mg by mouth at bedtime.           BP 108/61  Pulse 51  Temp 98.2 F (36.8 C) (Oral)  Resp 16  Ht 5\' 3"  (1.6 m)  Wt 116 lb (52.617 kg)  BMI 20.55 kg/m2  SpO2 98%  Physical Exam  Nursing notes reviewed.  Electronic medical record reviewed. VITAL SIGNS:   Filed Vitals:   10/18/12 1823  BP: 108/61  Pulse: 51  Temp: 98.2 F (36.8 C)  TempSrc: Oral  Resp: 16  Height: 5\' 3"  (1.6 m)  Weight: 116 lb (52.617 kg)  SpO2: 98%   CONSTITUTIONAL: Awake, oriented x4, appears non-toxic HENT: Atraumatic, normocephalic, oral mucosa pink and moist, airway patent. Nares patent without drainage. External ears normal. EYES: Conjunctiva clear, EOMI, PERRLA NECK: Trachea midline, non-tender, supple CARDIOVASCULAR: Bradycardic, Normal rhythm, No murmurs, rubs, gallops PULMONARY/CHEST: Clear to auscultation, no rhonchi, wheezes, or rales. Symmetrical breath sounds. Non-tender. ABDOMINAL: Non-distended, soft, non-tender - no rebound or guarding.  BS normal. NEUROLOGIC: Non-focal, moving all four extremities, no gross sensory or motor deficits. EXTREMITIES: No clubbing, cyanosis, or edema SKIN: Warm, Dry, No erythema, No rash  ED Course  Procedures (including critical care time)  Date: 10/18/2012   Rate: 56  Rhythm: normal sinus rhythm  QRS Axis: normal  Intervals: normal  ST/T Wave abnormalities: normal  Conduction Disutrbances: none  Narrative Interpretation: No significant changes when compared with prior EKG dated 03/17/2011. Patient was bradycardic at that time as well with a heart rate of 58 beats per minute. T waves look similar     Labs Reviewed  COMPREHENSIVE METABOLIC PANEL - Abnormal; Notable for the following:    AST 44 (*)     ALT 43 (*)     Total Bilirubin 0.2 (*)     All other components within normal limits  URINALYSIS, ROUTINE W REFLEX MICROSCOPIC - Abnormal; Notable for the following:    APPearance CLOUDY (*)     Ketones, ur 15 (*)     Nitrite POSITIVE (*)     Leukocytes, UA SMALL (*)     All other components within normal limits  URINE MICROSCOPIC-ADD ON - Abnormal; Notable for the following:    Bacteria, UA MANY (*)     All other components within normal limits  URINE CULTURE   No results found.   1. Abnormal laboratory test result   2. Urinary tract infection   3. Chronic diarrhea       MDM  Brandi Vazquez is a 56 y.o. female presenting for abnormal laboratory results. According to laboratory results faxed over from [facility] gastroenterology, patient's potassium was 6.7 earlier today. No mention of absence of hemolysis. Patient has had no symptoms such as chest pain, palpitations, shortness of breath dyspnea on exertion etc., there are no changes on EKG which suggest hyperkalemia. Recheck potassium as well as LFTs and UA looking for changes consistent with renal tubular disease in the patient's complaining about frequency so rule out infection.  Patient's EKG is normal - no change versus prior EKG, she has a heart rate of 56 and 58 in the past, she is asymptomatic with respect to bradycardia and has adequate blood pressure.  CMP shows mild elevations in AST and ALT with a mild elevation in bilirubin to 0.2-none of these are new. Patient does have  injection of urinary tract infection with frequency, small leukocyte esterase, positive nitrite and positive many bacteria with 7-10 whites. Will treat and culture.  Discussed results with patient that the sample earlier was likely hemolyzed. She will followup with Dr. Bosie Clos as planned.  I explained the diagnosis and have given explicit precautions to return to the ER including any other new or worsening symptoms. The patient understands and accepts  the medical plan as it's been dictated and I have answered their questions. Discharge instructions concerning home care and prescriptions have been given.  The patient is STABLE and is discharged to home in good condition.              Jones Skene, MD 10/18/12 2027

## 2012-10-21 LAB — URINE CULTURE: Colony Count: >=100000

## 2012-10-22 NOTE — ED Notes (Signed)
+  Urine. Patient treated with Macrobid. Sensitive to same. Per protocol MD. °

## 2012-11-02 ENCOUNTER — Ambulatory Visit
Admission: RE | Admit: 2012-11-02 | Discharge: 2012-11-02 | Disposition: A | Discharge status: Home / Self Care | Payer: BC Managed Care – PPO | Source: Ambulatory Visit | Attending: Gastroenterology | Admitting: Gastroenterology

## 2012-11-02 DIAGNOSIS — B182 Chronic viral hepatitis C: Secondary | ICD-10-CM

## 2012-11-02 MED ORDER — IOHEXOL 300 MG/ML  SOLN
100.0000 mL | Freq: Once | INTRAMUSCULAR | Status: AC | PRN
  Administered 2012-11-02: 100 mL via INTRAVENOUS

## 2012-11-15 ENCOUNTER — Other Ambulatory Visit: Payer: Self-pay | Admitting: Family Medicine

## 2012-11-15 DIAGNOSIS — E279 Disorder of adrenal gland, unspecified: Secondary | ICD-10-CM

## 2012-11-15 DIAGNOSIS — E278 Other specified disorders of adrenal gland: Secondary | ICD-10-CM

## 2012-11-16 ENCOUNTER — Ambulatory Visit
Admission: RE | Admit: 2012-11-16 | Discharge: 2012-11-16 | Disposition: A | Discharge status: Home / Self Care | Payer: BC Managed Care – PPO | Source: Ambulatory Visit | Attending: Family Medicine | Admitting: Family Medicine

## 2012-11-16 DIAGNOSIS — E278 Other specified disorders of adrenal gland: Secondary | ICD-10-CM

## 2012-11-16 MED ORDER — GADOBENATE DIMEGLUMINE 529 MG/ML IV SOLN
10.0000 mL | Freq: Once | INTRAVENOUS | Status: AC | PRN
  Administered 2012-11-16: 10 mL via INTRAVENOUS

## 2013-02-14 ENCOUNTER — Ambulatory Visit
Admission: RE | Admit: 2013-02-14 | Discharge: 2013-02-14 | Disposition: A | Discharge status: Home / Self Care | Payer: No Typology Code available for payment source | Source: Ambulatory Visit | Attending: Student | Admitting: Student

## 2013-02-14 ENCOUNTER — Other Ambulatory Visit: Payer: Self-pay | Admitting: Student

## 2013-02-14 DIAGNOSIS — R7611 Nonspecific reaction to tuberculin skin test without active tuberculosis: Secondary | ICD-10-CM

## 2013-10-08 ENCOUNTER — Ambulatory Visit: Payer: Self-pay | Admitting: Psychiatry

## 2013-11-07 ENCOUNTER — Ambulatory Visit: Payer: Self-pay | Admitting: Psychiatry

## 2013-12-13 ENCOUNTER — Ambulatory Visit: Payer: Self-pay | Admitting: Psychiatry

## 2014-01-14 ENCOUNTER — Other Ambulatory Visit: Payer: Self-pay | Admitting: *Deleted

## 2014-01-16 LAB — DRUGS OF ABUSE SCREEN W/O ALC, ROUTINE URINE
Amphetamine Screen, Ur: NEGATIVE
Barbiturate Quant, Ur: NEGATIVE
Benzodiazepines.: NEGATIVE
Cocaine Metabolites: NEGATIVE
Creatinine,U: 20.6 mg/dL
Marijuana Metabolite: POSITIVE — AB
Methadone: NEGATIVE
Opiate Screen, Urine: NEGATIVE
Phencyclidine (PCP): NEGATIVE
Propoxyphene: NEGATIVE

## 2014-01-20 LAB — CANNABANOIDS (GC/LC/MS), URINE: THC-COOH (GC/LC/MS), ur confirm: 32 ng/mL — ABNORMAL HIGH

## 2014-01-28 ENCOUNTER — Other Ambulatory Visit: Payer: Self-pay | Admitting: Nurse Practitioner

## 2014-01-28 DIAGNOSIS — C220 Liver cell carcinoma: Secondary | ICD-10-CM

## 2014-02-04 ENCOUNTER — Other Ambulatory Visit: Payer: Self-pay

## 2014-02-07 ENCOUNTER — Ambulatory Visit
Admission: RE | Admit: 2014-02-07 | Discharge: 2014-02-07 | Disposition: A | Discharge status: Home / Self Care | Payer: BC Managed Care – PPO | Source: Ambulatory Visit | Attending: Nurse Practitioner | Admitting: Nurse Practitioner

## 2014-02-07 DIAGNOSIS — C220 Liver cell carcinoma: Secondary | ICD-10-CM

## 2014-02-09 ENCOUNTER — Encounter (HOSPITAL_BASED_OUTPATIENT_CLINIC_OR_DEPARTMENT_OTHER): Payer: Self-pay | Admitting: Emergency Medicine

## 2014-02-09 ENCOUNTER — Emergency Department (HOSPITAL_BASED_OUTPATIENT_CLINIC_OR_DEPARTMENT_OTHER)
Admission: EM | Admit: 2014-02-09 | Discharge: 2014-02-09 | Disposition: A | Discharge status: Home / Self Care | Payer: BC Managed Care – PPO | Attending: Emergency Medicine | Admitting: Emergency Medicine

## 2014-02-09 ENCOUNTER — Emergency Department (HOSPITAL_BASED_OUTPATIENT_CLINIC_OR_DEPARTMENT_OTHER): Payer: BC Managed Care – PPO

## 2014-02-09 DIAGNOSIS — Z79899 Other long term (current) drug therapy: Secondary | ICD-10-CM | POA: Insufficient documentation

## 2014-02-09 DIAGNOSIS — S40029A Contusion of unspecified upper arm, initial encounter: Secondary | ICD-10-CM | POA: Insufficient documentation

## 2014-02-09 DIAGNOSIS — R110 Nausea: Secondary | ICD-10-CM | POA: Insufficient documentation

## 2014-02-09 DIAGNOSIS — F17200 Nicotine dependence, unspecified, uncomplicated: Secondary | ICD-10-CM | POA: Insufficient documentation

## 2014-02-09 DIAGNOSIS — F411 Generalized anxiety disorder: Secondary | ICD-10-CM | POA: Insufficient documentation

## 2014-02-09 DIAGNOSIS — W1809XA Striking against other object with subsequent fall, initial encounter: Secondary | ICD-10-CM | POA: Insufficient documentation

## 2014-02-09 DIAGNOSIS — S40022A Contusion of left upper arm, initial encounter: Secondary | ICD-10-CM

## 2014-02-09 DIAGNOSIS — S069X9A Unspecified intracranial injury with loss of consciousness of unspecified duration, initial encounter: Secondary | ICD-10-CM

## 2014-02-09 DIAGNOSIS — F3130 Bipolar disorder, current episode depressed, mild or moderate severity, unspecified: Secondary | ICD-10-CM | POA: Insufficient documentation

## 2014-02-09 DIAGNOSIS — S0990XA Unspecified injury of head, initial encounter: Secondary | ICD-10-CM | POA: Insufficient documentation

## 2014-02-09 DIAGNOSIS — Z8619 Personal history of other infectious and parasitic diseases: Secondary | ICD-10-CM | POA: Insufficient documentation

## 2014-02-09 DIAGNOSIS — Y929 Unspecified place or not applicable: Secondary | ICD-10-CM | POA: Insufficient documentation

## 2014-02-09 DIAGNOSIS — Y939 Activity, unspecified: Secondary | ICD-10-CM | POA: Insufficient documentation

## 2014-02-09 MED ORDER — HYDROCODONE-ACETAMINOPHEN 5-325 MG PO TABS
1.0000 | ORAL_TABLET | Freq: Once | ORAL | Status: AC
  Administered 2014-02-09: 1 via ORAL
  Filled 2014-02-09: qty 1

## 2014-02-09 MED ORDER — HYDROCODONE-ACETAMINOPHEN 5-325 MG PO TABS: 2.0000 | ORAL_TABLET | ORAL | Status: AC | PRN

## 2014-02-09 NOTE — ED Notes (Signed)
Tripped and fell while carrying food, fell off a 3 foot deck.  C/o left arm pain, neck pain, and head pain.  Reports 'I blackout out.'

## 2014-02-09 NOTE — Discharge Instructions (Signed)
Contusion A contusion is a deep bruise. Contusions are the result of an injury that caused bleeding under the skin. The contusion may turn blue, purple, or yellow. Minor injuries will give you a painless contusion, but more severe contusions may stay painful and swollen for a few weeks.  CAUSES  A contusion is usually caused by a blow, trauma, or direct force to an area of the body. SYMPTOMS   Swelling and redness of the injured area.  Bruising of the injured area.  Tenderness and soreness of the injured area.  Pain. DIAGNOSIS  The diagnosis can be made by taking a history and physical exam. An X-ray, CT scan, or MRI may be needed to determine if there were any associated injuries, such as fractures. TREATMENT  Specific treatment will depend on what area of the body was injured. In general, the best treatment for a contusion is resting, icing, elevating, and applying cold compresses to the injured area. Over-the-counter medicines may also be recommended for pain control. Ask your caregiver what the best treatment is for your contusion. HOME CARE INSTRUCTIONS   Put ice on the injured area.  Put ice in a plastic bag.  Place a towel between your skin and the bag.  Leave the ice on for 15-20 minutes, 03-04 times a day.  Only take over-the-counter or prescription medicines for pain, discomfort, or fever as directed by your caregiver. Your caregiver may recommend avoiding anti-inflammatory medicines (aspirin, ibuprofen, and naproxen) for 48 hours because these medicines may increase bruising.  Rest the injured area.  If possible, elevate the injured area to reduce swelling. SEEK IMMEDIATE MEDICAL CARE IF:   You have increased bruising or swelling.  You have pain that is getting worse.  Your swelling or pain is not relieved with medicines. MAKE SURE YOU:   Understand these instructions.  Will watch your condition.  Will get help right away if you are not doing well or get  worse. Document Released: 08/03/2005 Document Revised: 01/16/2012 Document Reviewed: 08/29/2011 [facility] Patient Information 2014 [place], [place].  Concussion, Adult A concussion, or closed-head injury, is a brain injury caused by a direct blow to the head or by a quick and sudden movement (jolt) of the head or neck. Concussions are usually not life-threatening. Even so, the effects of a concussion can be serious. If you have had a concussion before, you are more likely to experience concussion-like symptoms after a direct blow to the head.  CAUSES   Direct blow to the head, such as from running into another player during a soccer game, being hit in a fight, or hitting your head on a hard surface.  A jolt of the head or neck that causes the brain to move back and forth inside the skull, such as in a car crash. SIGNS AND SYMPTOMS  The signs of a concussion can be hard to notice. Early on, they may be missed by you, family members, and health care providers. You may look fine but act or feel differently. Symptoms are usually temporary, but they may last for days, weeks, or even longer. Some symptoms may appear right away while others may not show up for hours or days. Every head injury is different. Symptoms include:   Mild to moderate headaches that will not go away.  A feeling of pressure inside your head.  Having more trouble than usual:   Learning or remembering things you have heard.  Answering questions.  Paying attention or concentrating.   Organizing daily  tasks.   Making decisions and solving problems.   Slowness in thinking, acting or reacting, speaking, or reading.   Getting lost or being easily confused.   Feeling tired all the time or lacking energy (fatigued).   Feeling drowsy.   Sleep disturbances.   Sleeping more than usual.   Sleeping less than usual.   Trouble falling asleep.   Trouble sleeping (insomnia).   Loss of balance or feeling  lightheaded or dizzy.   Nausea or vomiting.   Numbness or tingling.   Increased sensitivity to:   Sounds.   Lights.   Distractions.   Vision problems or eyes that tire easily.   Diminished sense of taste or smell.   Ringing in the ears.   Mood changes such as feeling sad or anxious.   Becoming easily irritated or angry for little or no reason.   Lack of motivation.  Seeing or hearing things other people do not see or hear (hallucinations). DIAGNOSIS  Your health care provider can usually diagnose a concussion based on a description of your injury and symptoms. He or she will ask whether you passed out (lost consciousness) and whether you are having trouble remembering events that happened right before and during your injury.  Your evaluation might include:   A brain scan to look for signs of injury to the brain. Even if the test shows no injury, you may still have a concussion.   Blood tests to be sure other problems are not present. TREATMENT   Concussions are usually treated in an emergency department, in urgent care, or at a clinic. You may need to stay in the hospital overnight for further treatment.   Tell your health care provider if you are taking any medicines, including prescription medicines, over-the-counter medicines, and natural remedies. Some medicines, such as blood thinners (anticoagulants) and aspirin, may increase the chance of complications. Also tell your health care provider whether you have had alcohol or are taking illegal drugs. This information may affect treatment.  Your health care provider will send you home with important instructions to follow.  How fast you will recover from a concussion depends on many factors. These factors include how severe your concussion is, what part of your brain was injured, your age, and how healthy you were before the concussion.  Most people with mild injuries recover fully. Recovery can take time.  In general, recovery is slower in older persons. Also, persons who have had a concussion in the past or have other medical problems may find that it takes longer to recover from their current injury. HOME CARE INSTRUCTIONS  General Instructions  Carefully follow the directions your health care provider gave you.  Only take over-the-counter or prescription medicines for pain, discomfort, or fever as directed by your health care provider.  Take only those medicines that your health care provider has approved.  Do not drink alcohol until your health care provider says you are well enough to do so. Alcohol and certain other drugs may slow your recovery and can put you at risk of further injury.  If it is harder than usual to remember things, write them down.  If you are easily distracted, try to do one thing at a time. For example, do not try to watch TV while fixing dinner.  Talk with family members or close friends when making important decisions.  Keep all follow-up appointments. Repeated evaluation of your symptoms is recommended for your recovery.  Watch your symptoms and tell others  to do the same. Complications sometimes occur after a concussion. Older adults with a brain injury may have a higher risk of serious complications such as of a blood clot on the brain.  Tell your teachers, school nurse, school counselor, coach, athletic trainer, or work Production designer, theatre/television/filmmanager about your injury, symptoms, and restrictions. Tell them about what you can or cannot do. They should watch for:   Increased problems with attention or concentration.   Increased difficulty remembering or learning new information.   Increased time needed to complete tasks or assignments.   Increased irritability or decreased ability to cope with stress.   Increased symptoms.   Rest. Rest helps the brain to heal. Make sure you:  Get plenty of sleep at night. Avoid staying up late at night.  Keep the same bedtime hours  on weekends and weekdays.  Rest during the day. Take daytime naps or rest breaks when you feel tired.  Limit activities that require a lot of thought or concentration. These includes   Doing homework or job-related work.   Watching TV.   Working on the computer.  Avoid any situation where there is potential for another head injury (football, hockey, soccer, basketball, martial arts, downhill snow sports and horseback riding). Your condition will get worse every time you experience a concussion. You should avoid these activities until you are evaluated by the appropriate follow-up caregivers. Returning To Your Regular Activities You will need to return to your normal activities slowly, not all at once. You must give your body and brain enough time for recovery.  Do not return to sports or other athletic activities until your health care provider tells you it is safe to do so.  Ask your health care provider when you can drive, ride a bicycle, or operate heavy machinery. Your ability to react may be slower after a brain injury. Never do these activities if you are dizzy.  Ask your health care provider about when you can return to work or school. Preventing Another Concussion It is very important to avoid another brain injury, especially before you have recovered. In rare cases, another injury can lead to permanent brain damage, brain swelling, or death. The risk of this is greatest during the first 7 10 days after a head injury. Avoid injuries by:   Wearing a seat belt when riding in a car.   Drinking alcohol only in moderation.   Wearing a helmet when biking, skiing, skateboarding, skating, or doing similar activities.  Avoiding activities that could lead to a second concussion, such as contact or recreational sports, until your health care provider says it is OK.  Taking safety measures in your home.   Remove clutter and tripping hazards from floors and stairways.   Use grab  bars in bathrooms and handrails by stairs.   Place non-slip mats on floors and in bathtubs.   Improve lighting in dim areas. SEEK MEDICAL CARE IF:   You have increased problems paying attention or concentrating.   You have increased difficulty remembering or learning new information.   You need more time to complete tasks or assignments than before.   You have increased irritability or decreased ability to cope with stress.  You have more symptoms than before. Seek medical care if you have any of the following symptoms for more than 2 weeks after your injury:   Lasting (chronic) headaches.   Dizziness or balance problems.   Nausea.  Vision problems.   Increased sensitivity to noise or light.  Depression or mood swings.   Anxiety or irritability.   Memory problems.   Difficulty concentrating or paying attention.   Sleep problems.   Feeling tired all the time. SEEK IMMEDIATE MEDICAL CARE IF:   You have severe or worsening headaches. These may be a sign of a blood clot in the brain.  You have weakness (even if only in one hand, leg, or part of the face).  You have numbness.  You have decreased coordination.   You vomit repeatedly.  You have increased sleepiness.  One pupil is larger than the other.   You have convulsions.   You have slurred speech.   You have increased confusion. This may be a sign of a blood clot in the brain.  You have increased restlessness, agitation, or irritability.   You are unable to recognize people or places.   You have neck pain.   It is difficult to wake you up.   You have unusual behavior changes.   You lose consciousness. MAKE SURE YOU:   Understand these instructions.  Will watch your condition.  Will get help right away if you are not doing well or get worse. Document Released: 01/14/2004 Document Revised: 06/26/2013 Document Reviewed: 05/16/2013 [facility] Patient Information 2014  [place], [place].

## 2014-02-09 NOTE — ED Notes (Signed)
Pt given rx x 1 for norco and ice pack for home use- d/c home with ride

## 2014-02-09 NOTE — ED Provider Notes (Signed)
CSN: [id number]     Arrival date & time 02/09/14  1410 History   First MD Initiated Contact with Patient 02/09/14 1430     Chief Complaint  Patient presents with  . Arm Injury     (Consider location/radiation/quality/duration/timing/severity/associated sxs/prior Treatment) Patient is a 58 y.o. female presenting with fall. The history is provided by the patient. No language interpreter was used.  Fall This is a new problem. The current episode started today. The problem has been unchanged. Associated symptoms include myalgias and nausea. Pertinent negatives include no vomiting. Nothing aggravates the symptoms. She has tried nothing for the symptoms. The treatment provided no relief.  Pt reports she fell and hit her head.  Pt/family reports pt unconscious for several minutes.  Pt complains of pain in left upper arm  Past Medical History  Diagnosis Date  . Anxiety   . Depression   . Bipolar 1 disorder   . Hepatitis C    Past Surgical History  Procedure Laterality Date  . Abdominal hysterectomy    . Foot surgery     No family history on file. History  Substance Use Topics  . Smoking status: Current Every Day Smoker -- 0.50 packs/day    Types: Cigarettes  . Smokeless tobacco: Not on file  . Alcohol Use: Yes     Comment: occasionally   OB History   Grav Para Term Preterm Abortions TAB SAB Ect Mult Living                 Review of Systems  Gastrointestinal: Positive for nausea. Negative for vomiting.  Musculoskeletal: Positive for myalgias.  All other systems reviewed and are negative.      Allergies  Review of patient's allergies indicates no known allergies.  Home Medications   Current Outpatient Rx  Name  Route  Sig  Dispense  Refill  . Lurasidone HCl (LATUDA PO)   Oral   Take by mouth.         . ARIPiprazole (ABILIFY) 10 MG tablet   Oral   Take 10 mg by mouth at bedtime.         Marland Kitchen buPROPion (WELLBUTRIN XL) 150 MG 24 hr tablet   Oral   Take 150 mg by  mouth daily.         . citalopram (CELEXA) 40 MG tablet   Oral   Take 40 mg by mouth daily.         . clonazePAM (KLONOPIN) 1 MG tablet   Oral   Take 1 mg by mouth 2 (two) times daily.         . nitrofurantoin, macrocrystal-monohydrate, (MACROBID) 100 MG capsule   Oral   Take 1 capsule (100 mg total) by mouth 2 (two) times daily.   10 capsule   0   . QUEtiapine (SEROQUEL XR) 400 MG 24 hr tablet   Oral   Take 400 mg by mouth at bedtime.         Marland Kitchen zolpidem (AMBIEN CR) 12.5 MG CR tablet   Oral   Take 12.5 mg by mouth at bedtime.          BP 91/50  Pulse 72  Temp(Src) 98 F (36.7 C) (Oral)  Resp 16  Ht 5\' 3"  (1.6 m)  Wt 106 lb (48.081 kg)  BMI 18.78 kg/m2  SpO2 100% Physical Exam  Nursing note and vitals reviewed. Constitutional: She appears well-developed and well-nourished.  HENT:  Head: Normocephalic.  Right Ear: External ear normal.  Left Ear: External ear normal.  Nose: Nose normal.  Mouth/Throat: Oropharynx is clear and moist.  Tender right scalp.   Tender erythematous area left upper arm  Eyes: Conjunctivae and EOM are normal. Pupils are equal, round, and reactive to light.  Neck: Normal range of motion. Neck supple.  Cardiovascular: Normal rate and regular rhythm.   Pulmonary/Chest: Effort normal.  Abdominal: Soft.  Neurological: She is alert.  Skin: Skin is warm.  Psychiatric: She has a normal mood and affect.    ED Course  Procedures (including critical care time) Labs Review Labs Reviewed - No data to display Imaging Review Ct Head Wo Contrast  02/09/2014   CLINICAL DATA:  Head trauma secondary to a fall.  EXAM: CT HEAD WITHOUT CONTRAST  TECHNIQUE: Contiguous axial images were obtained from the base of the skull through the vertex without intravenous contrast.  COMPARISON:  Report of CT scan dated 04/01/1999  FINDINGS: No mass lesion. No midline shift. No acute hemorrhage or hematoma. No extra-axial fluid collections. No evidence of acute  infarction. Brain parenchyma is normal. No acute osseous abnormality. Small air-fluid level in the left maxillary sinus.  IMPRESSION: Essentially normal exam.   Electronically Signed   By: Rozetta Nunnery M.D.   On: 02/09/2014 15:10   Dg Humerus Left  02/09/2014   CLINICAL DATA:  Pain post trauma  EXAM: LEFT HUMERUS - 2+ VIEW  COMPARISON:  None.  FINDINGS: Frontal and lateral views were obtained. No fracture or dislocation. Joint spaces appear intact. No abnormal periosteal reaction.  IMPRESSION: No abnormality noted.   Electronically Signed   By: Lowella Grip M.D.   On: 02/09/2014 15:15     EKG Interpretation None      MDM   Final diagnoses:  Contusion of left arm  Head injury, acute, with loss of consciousness    Pt advised to follow up with Dr. Barbaraann Barthel for recheck.   Ice to area of swelling.      [place], PA-C 02/09/14 1616

## 2014-02-10 NOTE — ED Provider Notes (Signed)
Medical screening examination/treatment/procedure(s) were performed by non-physician practitioner and as supervising physician I was immediately available for consultation/collaboration.   EKG Interpretation None        Blanchie Dessert, MD 02/10/14 1724

## 2015-01-07 ENCOUNTER — Other Ambulatory Visit: Payer: Self-pay | Admitting: Family Medicine

## 2015-01-07 DIAGNOSIS — R9389 Abnormal findings on diagnostic imaging of other specified body structures: Secondary | ICD-10-CM

## 2015-01-12 ENCOUNTER — Ambulatory Visit
Admission: RE | Admit: 2015-01-12 | Discharge: 2015-01-12 | Disposition: A | Discharge status: Home / Self Care | Payer: 59 | Source: Ambulatory Visit | Attending: Family Medicine | Admitting: Family Medicine

## 2015-01-12 DIAGNOSIS — R9389 Abnormal findings on diagnostic imaging of other specified body structures: Secondary | ICD-10-CM

## 2015-01-12 MED ORDER — IOPAMIDOL (ISOVUE-300) INJECTION 61%
75.0000 mL | Freq: Once | INTRAVENOUS | Status: AC | PRN
  Administered 2015-01-12: 75 mL via INTRAVENOUS

## 2016-05-24 ENCOUNTER — Other Ambulatory Visit: Payer: Self-pay | Admitting: Family Medicine

## 2016-05-24 DIAGNOSIS — Z1231 Encounter for screening mammogram for malignant neoplasm of breast: Secondary | ICD-10-CM

## 2016-06-02 ENCOUNTER — Ambulatory Visit: Payer: 59

## 2016-07-20 ENCOUNTER — Ambulatory Visit: Payer: 59

## 2016-07-22 ENCOUNTER — Ambulatory Visit
Admission: RE | Admit: 2016-07-22 | Discharge: 2016-07-22 | Disposition: A | Discharge status: Home / Self Care | Payer: 59 | Source: Ambulatory Visit | Attending: Family Medicine | Admitting: Family Medicine

## 2016-07-22 DIAGNOSIS — Z1231 Encounter for screening mammogram for malignant neoplasm of breast: Secondary | ICD-10-CM

## 2017-04-04 ENCOUNTER — Ambulatory Visit (HOSPITAL_BASED_OUTPATIENT_CLINIC_OR_DEPARTMENT_OTHER)
Admission: RE | Admit: 2017-04-04 | Discharge: 2017-04-04 | Disposition: A | Discharge status: Home / Self Care | Payer: 59 | Source: Ambulatory Visit | Attending: Internal Medicine | Admitting: Internal Medicine

## 2017-04-04 ENCOUNTER — Other Ambulatory Visit (HOSPITAL_BASED_OUTPATIENT_CLINIC_OR_DEPARTMENT_OTHER): Payer: Self-pay | Admitting: Internal Medicine

## 2017-04-04 DIAGNOSIS — M79604 Pain in right leg: Secondary | ICD-10-CM | POA: Insufficient documentation

## 2017-04-05 ENCOUNTER — Other Ambulatory Visit (HOSPITAL_BASED_OUTPATIENT_CLINIC_OR_DEPARTMENT_OTHER): Payer: Self-pay | Admitting: Internal Medicine

## 2017-04-05 DIAGNOSIS — R079 Chest pain, unspecified: Secondary | ICD-10-CM

## 2017-04-05 DIAGNOSIS — M542 Cervicalgia: Secondary | ICD-10-CM

## 2017-04-06 ENCOUNTER — Ambulatory Visit (HOSPITAL_BASED_OUTPATIENT_CLINIC_OR_DEPARTMENT_OTHER)
Admission: RE | Admit: 2017-04-06 | Discharge: 2017-04-06 | Disposition: A | Discharge status: Home / Self Care | Payer: 59 | Source: Ambulatory Visit | Attending: Internal Medicine | Admitting: Internal Medicine

## 2017-04-06 DIAGNOSIS — R079 Chest pain, unspecified: Secondary | ICD-10-CM | POA: Diagnosis present

## 2017-04-06 DIAGNOSIS — I6522 Occlusion and stenosis of left carotid artery: Secondary | ICD-10-CM | POA: Diagnosis not present

## 2017-04-06 DIAGNOSIS — M542 Cervicalgia: Secondary | ICD-10-CM

## 2017-06-07 ENCOUNTER — Ambulatory Visit (INDEPENDENT_AMBULATORY_CARE_PROVIDER_SITE_OTHER): Payer: 59 | Admitting: Orthopaedic Surgery

## 2017-06-07 DIAGNOSIS — M542 Cervicalgia: Secondary | ICD-10-CM | POA: Diagnosis not present
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
## 2017-06-08 ENCOUNTER — Other Ambulatory Visit (INDEPENDENT_AMBULATORY_CARE_PROVIDER_SITE_OTHER): Payer: Self-pay

## 2017-06-08 DIAGNOSIS — M542 Cervicalgia: Secondary | ICD-10-CM

## 2017-06-19 ENCOUNTER — Ambulatory Visit
Admission: RE | Admit: 2017-06-19 | Discharge: 2017-06-19 | Disposition: A | Discharge status: Home / Self Care | Payer: 59 | Source: Ambulatory Visit | Attending: Orthopaedic Surgery | Admitting: Orthopaedic Surgery

## 2017-06-19 DIAGNOSIS — M542 Cervicalgia: Secondary | ICD-10-CM

## 2017-06-22 ENCOUNTER — Ambulatory Visit (INDEPENDENT_AMBULATORY_CARE_PROVIDER_SITE_OTHER): Payer: 59 | Admitting: Orthopaedic Surgery

## 2017-06-22 DIAGNOSIS — M542 Cervicalgia: Secondary | ICD-10-CM | POA: Diagnosis not present
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
## 2017-08-25 ENCOUNTER — Other Ambulatory Visit: Payer: Self-pay | Admitting: Family Medicine

## 2017-08-25 DIAGNOSIS — Z1231 Encounter for screening mammogram for malignant neoplasm of breast: Secondary | ICD-10-CM

## 2017-09-11 ENCOUNTER — Ambulatory Visit
Admission: RE | Admit: 2017-09-11 | Discharge: 2017-09-11 | Disposition: A | Discharge status: Home / Self Care | Payer: 59 | Source: Ambulatory Visit | Attending: Family Medicine | Admitting: Family Medicine

## 2017-09-11 DIAGNOSIS — Z1231 Encounter for screening mammogram for malignant neoplasm of breast: Secondary | ICD-10-CM

## 2017-09-12 ENCOUNTER — Other Ambulatory Visit: Payer: Self-pay | Admitting: Family Medicine

## 2017-09-12 DIAGNOSIS — R928 Other abnormal and inconclusive findings on diagnostic imaging of breast: Secondary | ICD-10-CM

## 2017-09-19 ENCOUNTER — Ambulatory Visit
Admission: RE | Admit: 2017-09-19 | Discharge: 2017-09-19 | Disposition: A | Discharge status: Home / Self Care | Payer: 59 | Source: Ambulatory Visit | Attending: Family Medicine | Admitting: Family Medicine

## 2017-09-19 DIAGNOSIS — R928 Other abnormal and inconclusive findings on diagnostic imaging of breast: Secondary | ICD-10-CM

## 2018-01-12 ENCOUNTER — Other Ambulatory Visit: Payer: Self-pay | Admitting: Gastroenterology

## 2018-01-12 DIAGNOSIS — R1011 Right upper quadrant pain: Secondary | ICD-10-CM

## 2018-01-12 DIAGNOSIS — R634 Abnormal weight loss: Secondary | ICD-10-CM

## 2018-01-15 ENCOUNTER — Ambulatory Visit
Admission: RE | Admit: 2018-01-15 | Discharge: 2018-01-15 | Disposition: A | Discharge status: Home / Self Care | Payer: BLUE CROSS/BLUE SHIELD | Source: Ambulatory Visit | Attending: Gastroenterology | Admitting: Gastroenterology

## 2018-01-15 DIAGNOSIS — R1011 Right upper quadrant pain: Secondary | ICD-10-CM

## 2018-01-15 DIAGNOSIS — R634 Abnormal weight loss: Secondary | ICD-10-CM

## 2018-04-03 DIAGNOSIS — J302 Other seasonal allergic rhinitis: Secondary | ICD-10-CM | POA: Diagnosis not present

## 2018-04-03 DIAGNOSIS — L309 Dermatitis, unspecified: Secondary | ICD-10-CM | POA: Diagnosis not present

## 2018-04-03 DIAGNOSIS — I6522 Occlusion and stenosis of left carotid artery: Secondary | ICD-10-CM | POA: Diagnosis not present

## 2018-10-03 ENCOUNTER — Other Ambulatory Visit: Payer: Self-pay | Admitting: Physician Assistant

## 2018-10-03 DIAGNOSIS — I6522 Occlusion and stenosis of left carotid artery: Secondary | ICD-10-CM | POA: Diagnosis not present

## 2018-10-03 DIAGNOSIS — F419 Anxiety disorder, unspecified: Secondary | ICD-10-CM | POA: Diagnosis not present

## 2018-10-03 DIAGNOSIS — I6529 Occlusion and stenosis of unspecified carotid artery: Secondary | ICD-10-CM

## 2018-10-10 ENCOUNTER — Ambulatory Visit
Admission: RE | Admit: 2018-10-10 | Discharge: 2018-10-10 | Disposition: A | Discharge status: Home / Self Care | Payer: BLUE CROSS/BLUE SHIELD | Source: Ambulatory Visit | Attending: Physician Assistant | Admitting: Physician Assistant

## 2018-10-10 DIAGNOSIS — I6529 Occlusion and stenosis of unspecified carotid artery: Secondary | ICD-10-CM

## 2019-01-02 ENCOUNTER — Other Ambulatory Visit: Payer: Self-pay | Admitting: Family Medicine

## 2019-01-02 DIAGNOSIS — Z1231 Encounter for screening mammogram for malignant neoplasm of breast: Secondary | ICD-10-CM

## 2019-01-25 ENCOUNTER — Other Ambulatory Visit (HOSPITAL_COMMUNITY): Payer: Self-pay | Admitting: Psychiatry

## 2019-01-25 DIAGNOSIS — F319 Bipolar disorder, unspecified: Secondary | ICD-10-CM

## 2019-02-01 ENCOUNTER — Ambulatory Visit: Payer: BLUE CROSS/BLUE SHIELD

## 2019-03-04 ENCOUNTER — Ambulatory Visit: Payer: BLUE CROSS/BLUE SHIELD

## 2019-04-23 ENCOUNTER — Ambulatory Visit: Payer: BLUE CROSS/BLUE SHIELD

## 2019-05-06 ENCOUNTER — Other Ambulatory Visit: Payer: Self-pay

## 2019-05-06 ENCOUNTER — Ambulatory Visit
Admission: RE | Admit: 2019-05-06 | Discharge: 2019-05-06 | Disposition: A | Discharge status: Home / Self Care | Payer: BC Managed Care – PPO | Source: Ambulatory Visit | Attending: Family Medicine | Admitting: Family Medicine

## 2019-05-06 DIAGNOSIS — Z1231 Encounter for screening mammogram for malignant neoplasm of breast: Secondary | ICD-10-CM

## 2019-05-08 ENCOUNTER — Other Ambulatory Visit: Payer: Self-pay | Admitting: Family Medicine

## 2019-05-08 DIAGNOSIS — N6310 Unspecified lump in the right breast, unspecified quadrant: Secondary | ICD-10-CM

## 2019-05-16 ENCOUNTER — Ambulatory Visit
Admission: RE | Admit: 2019-05-16 | Discharge: 2019-05-16 | Disposition: A | Discharge status: Home / Self Care | Payer: Medicare Other | Source: Ambulatory Visit | Attending: Family Medicine | Admitting: Family Medicine

## 2019-05-16 ENCOUNTER — Other Ambulatory Visit: Payer: Self-pay

## 2019-05-16 DIAGNOSIS — N6310 Unspecified lump in the right breast, unspecified quadrant: Secondary | ICD-10-CM

## 2020-01-09 DIAGNOSIS — K5900 Constipation, unspecified: Secondary | ICD-10-CM | POA: Diagnosis not present

## 2020-01-09 DIAGNOSIS — Z86010 Personal history of colonic polyps: Secondary | ICD-10-CM | POA: Diagnosis not present

## 2020-01-09 DIAGNOSIS — R1013 Epigastric pain: Secondary | ICD-10-CM | POA: Diagnosis not present

## 2020-01-09 DIAGNOSIS — K219 Gastro-esophageal reflux disease without esophagitis: Secondary | ICD-10-CM | POA: Diagnosis not present

## 2020-03-17 ENCOUNTER — Emergency Department (HOSPITAL_BASED_OUTPATIENT_CLINIC_OR_DEPARTMENT_OTHER): Payer: BC Managed Care – PPO

## 2020-03-17 ENCOUNTER — Other Ambulatory Visit: Payer: Self-pay

## 2020-03-17 ENCOUNTER — Encounter (HOSPITAL_BASED_OUTPATIENT_CLINIC_OR_DEPARTMENT_OTHER): Payer: Self-pay | Admitting: *Deleted

## 2020-03-17 ENCOUNTER — Emergency Department (HOSPITAL_BASED_OUTPATIENT_CLINIC_OR_DEPARTMENT_OTHER)
Admission: EM | Admit: 2020-03-17 | Discharge: 2020-03-17 | Disposition: A | Discharge status: Home / Self Care | Payer: BC Managed Care – PPO | Attending: Emergency Medicine | Admitting: Emergency Medicine

## 2020-03-17 DIAGNOSIS — I951 Orthostatic hypotension: Secondary | ICD-10-CM | POA: Diagnosis not present

## 2020-03-17 DIAGNOSIS — R002 Palpitations: Secondary | ICD-10-CM | POA: Insufficient documentation

## 2020-03-17 DIAGNOSIS — Z79899 Other long term (current) drug therapy: Secondary | ICD-10-CM | POA: Insufficient documentation

## 2020-03-17 DIAGNOSIS — F17210 Nicotine dependence, cigarettes, uncomplicated: Secondary | ICD-10-CM | POA: Diagnosis not present

## 2020-03-17 LAB — CBC
HCT: 49.3 % — ABNORMAL HIGH (ref 36.0–46.0)
Hemoglobin: 16.5 g/dL — ABNORMAL HIGH (ref 12.0–15.0)
MCH: 31 pg (ref 26.0–34.0)
MCHC: 33.5 g/dL (ref 30.0–36.0)
MCV: 92.7 fL (ref 80.0–100.0)
Platelets: 257 10*3/uL (ref 150–400)
RBC: 5.32 MIL/uL — ABNORMAL HIGH (ref 3.87–5.11)
RDW: 12.7 % (ref 11.5–15.5)
WBC: 11.8 10*3/uL — ABNORMAL HIGH (ref 4.0–10.5)
nRBC: 0 % (ref 0.0–0.2)

## 2020-03-17 LAB — BASIC METABOLIC PANEL
Anion gap: 12 (ref 5–15)
BUN: 15 mg/dL (ref 8–23)
CO2: 27 mmol/L (ref 22–32)
Calcium: 9.8 mg/dL (ref 8.9–10.3)
Chloride: 101 mmol/L (ref 98–111)
Creatinine, Ser: 0.75 mg/dL (ref 0.44–1.00)
GFR calc Af Amer: >60 mL/min (ref >60)
GFR calc non Af Amer: >60 mL/min (ref >60)
Glucose, Bld: 133 mg/dL — ABNORMAL HIGH (ref 70–99)
Potassium: 3.5 mmol/L (ref 3.5–5.1)
Sodium: 140 mmol/L (ref 135–145)

## 2020-03-17 LAB — TROPONIN I (HIGH SENSITIVITY)
Troponin I (High Sensitivity): 4 ng/L (ref <18)
Troponin I (High Sensitivity): 4 ng/L (ref <18)

## 2020-03-17 MED ORDER — SODIUM CHLORIDE 0.9 % IV BOLUS
1000.0000 mL | Freq: Once | INTRAVENOUS | Status: AC
  Administered 2020-03-17: 1000 mL via INTRAVENOUS

## 2020-03-17 MED ORDER — SODIUM CHLORIDE 0.9% FLUSH
3.0000 mL | Freq: Once | INTRAVENOUS | Status: DC
  Filled 2020-03-17: qty 3

## 2020-03-17 NOTE — ED Triage Notes (Signed)
Pt reports heart palpitations that started 4 days ago. States that today, when she had palpitations she became diaphoretic and had to 'have an emergency bowel movement'. Pt reports SOB, N/V with the episodes. No acute distress noted, pt ambulatory into the dept without distress. States that she had carotid US and was told that she has a 98% on left and 89% on right, also reports 100% 'in her heart'.

## 2020-03-17 NOTE — ED Notes (Signed)
Patient transported to radiology

## 2020-03-17 NOTE — Discharge Instructions (Signed)
Your heart rate went up when you start up.  You have some orthostasis.  Stay hydrated and eat some salt that will help your symptoms  Your heart enzyme test and your kidney function today is normal.  If you have persistent palpitations or feel like passing out, consider following up with your heart doctor.  Return to ER if you have worse palpitations, chest pain, trouble breathing, passing out.

## 2020-03-17 NOTE — ED Provider Notes (Signed)
[facility] EMERGENCY DEPARTMENT Provider Note   CSN: [id number] Arrival date & time: 03/17/20  1759     History Chief Complaint  Patient presents with  . Palpitations    Brandi Vazquez is a 64 y.o. female history of bipolar, depression, here presenting with dizziness, palpitations.  Patient states that she has intermittent palpitation for the last 4 days.  She states that today, she had an episode of palpitation and then became diaphoretic and had to have a bowel movement.  She states that she feels better right now.  She denies any shortness of breath at rest.  She told me about carotic stenosis.  She states that she has a family history of stroke but no personal history of stroke.  She also denies any headaches or passing out or focal weakness or trouble speaking.   The history is provided by the patient.       Past Medical History:  Diagnosis Date  . Anxiety   . Bipolar 1 disorder ([place])   . Depression   . Hepatitis C    Pt reports that she took the meds to reverse it.     Patient Active Problem List   Diagnosis Date Noted  . Cervicalgia 06/07/2017    Past Surgical History:  Procedure Laterality Date  . ABDOMINAL HYSTERECTOMY    . FOOT SURGERY       OB History   No obstetric history on file.     Family History  Problem Relation Age of Onset  . Breast cancer Neg Hx     Social History   Tobacco Use  . Smoking status: Current Every Day Smoker    Packs/day: 0.50    Types: Cigarettes  Substance Use Topics  . Alcohol use: Yes    Comment: occasionally  . Drug use: Yes    Types: Marijuana    Comment: "couple times a week"    Home Medications Prior to Admission medications   Medication Sig Start Date End Date Taking? Authorizing Provider  ARIPiprazole (ABILIFY) 10 MG tablet Take 10 mg by mouth at bedtime.    [provider]  buPROPion (WELLBUTRIN XL) 150 MG 24 hr tablet Take 150 mg by mouth daily.    [provider]    citalopram (CELEXA) 40 MG tablet Take 40 mg by mouth daily.    [provider]  clonazePAM (KLONOPIN) 1 MG tablet Take 1 mg by mouth 2 (two) times daily.    [provider]  HYDROcodone-acetaminophen (NORCO/VICODIN) 5-325 MG per tablet Take 2 tablets by mouth every 4 (four) hours as needed. 02/09/14   Fransico Meadow, PA-C  imipramine (TOFRANIL) 50 MG tablet Take 100 mg by mouth at bedtime. 10/27/19   [provider]  Lurasidone HCl (LATUDA PO) Take by mouth.    [provider]  nitrofurantoin, macrocrystal-monohydrate, (MACROBID) 100 MG capsule Take 1 capsule (100 mg total) by mouth 2 (two) times daily. 10/18/12   Bonk, Harrington Challenger, MD  QUEtiapine (SEROQUEL XR) 400 MG 24 hr tablet Take 400 mg by mouth at bedtime.    [provider]  simvastatin (ZOCOR) 10 MG tablet SMARTSIG:1 Tablet(s) By Mouth Every Evening 10/30/19   [provider]  zolpidem (AMBIEN CR) 12.5 MG CR tablet Take 12.5 mg by mouth at bedtime.    [provider]    Allergies    Patient has no known allergies.  Review of Systems   Review of Systems  Cardiovascular: Positive for palpitations.  All  other systems reviewed and are negative.   Physical Exam Updated Vital Signs BP (!) 126/96 (BP Location: Right Arm)   Pulse 83   Temp 98.3 F (36.8 C) (Oral)   Resp (!) 23   Ht 5\' 3"  (1.6 m)   Wt 47.6 kg   SpO2 99%   BMI 18.60 kg/m   Physical Exam Vitals and nursing note reviewed.  Constitutional:      Comments: Slightly anxious   HENT:     Head: Normocephalic.     Nose: Nose normal.  Eyes:     Extraocular Movements: Extraocular movements intact.     Pupils: Pupils are equal, round, and reactive to light.  Cardiovascular:     Rate and Rhythm: Normal rate and regular rhythm.     Pulses: Normal pulses.     Heart sounds: Normal heart sounds.  Pulmonary:     Effort: Pulmonary effort is normal.     Breath sounds: Normal breath sounds.  Abdominal:      General: Abdomen is flat.     Palpations: Abdomen is soft.  Musculoskeletal:        General: Normal range of motion.     Cervical back: Normal range of motion.  Skin:    General: Skin is warm.     Capillary Refill: Capillary refill takes less than 2 seconds.  Neurological:     General: No focal deficit present.     Mental Status: She is alert and oriented to person, place, and time.     Cranial Nerves: No cranial nerve deficit.  Psychiatric:        Mood and Affect: Mood normal.        Behavior: Behavior normal.     ED Results / Procedures / Treatments   Labs (all labs ordered are listed, but only abnormal results are displayed) Labs Reviewed  BASIC METABOLIC PANEL - Abnormal; Notable for the following components:      Result Value   Glucose, Bld 133 (*)    All other components within normal limits  CBC - Abnormal; Notable for the following components:   WBC 11.8 (*)    RBC 5.32 (*)    Hemoglobin 16.5 (*)    HCT 49.3 (*)    All other components within normal limits  TROPONIN I (HIGH SENSITIVITY)  TROPONIN I (HIGH SENSITIVITY)    EKG EKG Interpretation  Date/Time:  Tuesday Mar 17 2020 18:11:35 EDT Ventricular Rate:  87 PR Interval:    QRS Duration: 84 QT Interval:  343 QTC Calculation: 413 R Axis:   44 Text Interpretation: Sinus or ectopic atrial rhythm Short PR interval No significant change since last tracing Confirmed by Wandra Arthurs [phone]) on 03/17/2020 6:24:35 PM   Radiology DG Chest 2 View  Result Date: 03/17/2020 CLINICAL DATA:  Heart palpitations and chest discomfort for 4 days. EXAM: CHEST - 2 VIEW COMPARISON:  PA and lateral chest 04/04/2017. FINDINGS: The chest is hyperexpanded with attenuation of the pulmonary vasculature. Lungs clear. Heart size normal. No pneumothorax or pleural effusion. No acute or focal bony abnormality. IMPRESSION: The lungs appear emphysematous.  No acute disease. Electronically Signed   By: Inge Rise M.D.   On: 03/17/2020  19:12    Procedures Procedures (including critical care time)  Medications Ordered in ED Medications  sodium chloride flush (NS) 0.9 % injection 3 mL (3 mLs Intravenous Not Given 03/17/20 1821)  sodium chloride 0.9 % bolus 1,000 mL ( Intravenous Stopped 03/17/20 2029)  ED Course  I have reviewed the triage vital signs and the nursing notes.  Pertinent labs & imaging results that were available during my care of the patient were reviewed by me and considered in my medical decision making (see chart for details).    MDM Rules/Calculators/A&P                      Latarra York Ram Sabbagh is a 64 y.o. female here with palpitations, diaphoresis.  Patient appears very anxious.  Patient appears to have orthostatic symptoms.  Also consider vasovagal event as well. She has no recent travels and I have low suspicions for PE.  Also low risk for ACS .  Will get CBC, BMP, troponin x2, chest x-ray.  Will get orthostasis as well as give IV fluids.  9:38 PM Patient orthostatic initially. Given 1 l NS bolus. Labs unremarkable. Trop neg x 2. CXR clear. No further episodes of palpitations or diaphoresis. Stable for discharge. Will refer to cardiology for holter if she has persistent palpitations   Final Clinical Impression(s) / ED Diagnoses Final diagnoses:  None    Rx / DC Orders ED Discharge Orders    None       Drenda Freeze, MD 03/17/20 2139

## 2020-04-08 ENCOUNTER — Other Ambulatory Visit: Payer: Self-pay | Admitting: Family Medicine

## 2020-04-08 DIAGNOSIS — Z1231 Encounter for screening mammogram for malignant neoplasm of breast: Secondary | ICD-10-CM

## 2020-05-18 ENCOUNTER — Other Ambulatory Visit: Payer: Self-pay

## 2020-05-18 ENCOUNTER — Ambulatory Visit
Admission: RE | Admit: 2020-05-18 | Discharge: 2020-05-18 | Disposition: A | Discharge status: Home / Self Care | Payer: Medicare Other | Source: Ambulatory Visit | Attending: Family Medicine | Admitting: Family Medicine

## 2020-05-18 DIAGNOSIS — Z1231 Encounter for screening mammogram for malignant neoplasm of breast: Secondary | ICD-10-CM

## 2020-05-25 ENCOUNTER — Other Ambulatory Visit: Payer: Self-pay | Admitting: Gastroenterology

## 2020-05-25 DIAGNOSIS — R1084 Generalized abdominal pain: Secondary | ICD-10-CM

## 2020-06-09 ENCOUNTER — Other Ambulatory Visit: Payer: Medicare Other

## 2020-06-26 ENCOUNTER — Other Ambulatory Visit: Payer: Self-pay | Admitting: Gastroenterology

## 2020-06-26 DIAGNOSIS — R1084 Generalized abdominal pain: Secondary | ICD-10-CM

## 2020-06-26 DIAGNOSIS — B182 Chronic viral hepatitis C: Secondary | ICD-10-CM

## 2020-06-26 DIAGNOSIS — R110 Nausea: Secondary | ICD-10-CM

## 2020-07-05 IMAGING — DX DG CHEST 2V
2 series · 2 of 2 positions shown · non-contrast
Comparison: PA and lateral chest 04/04/2017.

CLINICAL DATA: Heart palpitations and chest discomfort for 4 days.

EXAM:
CHEST - 2 VIEW

[chest lat]
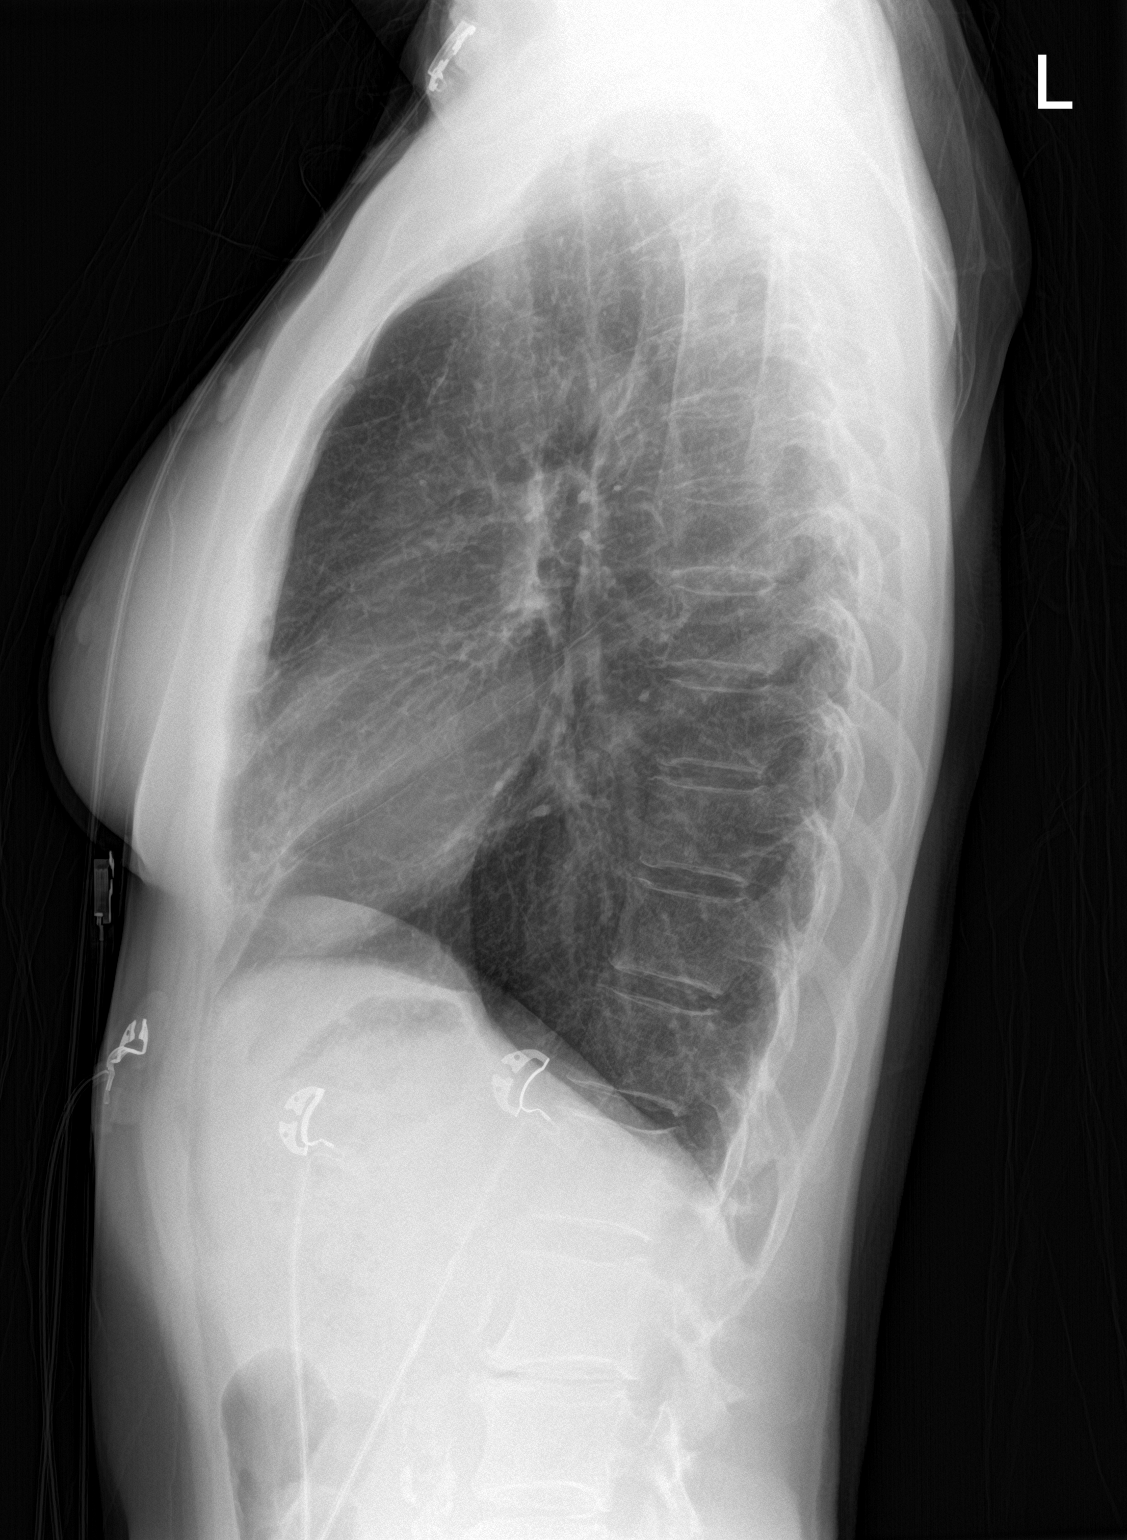

[chest pa]
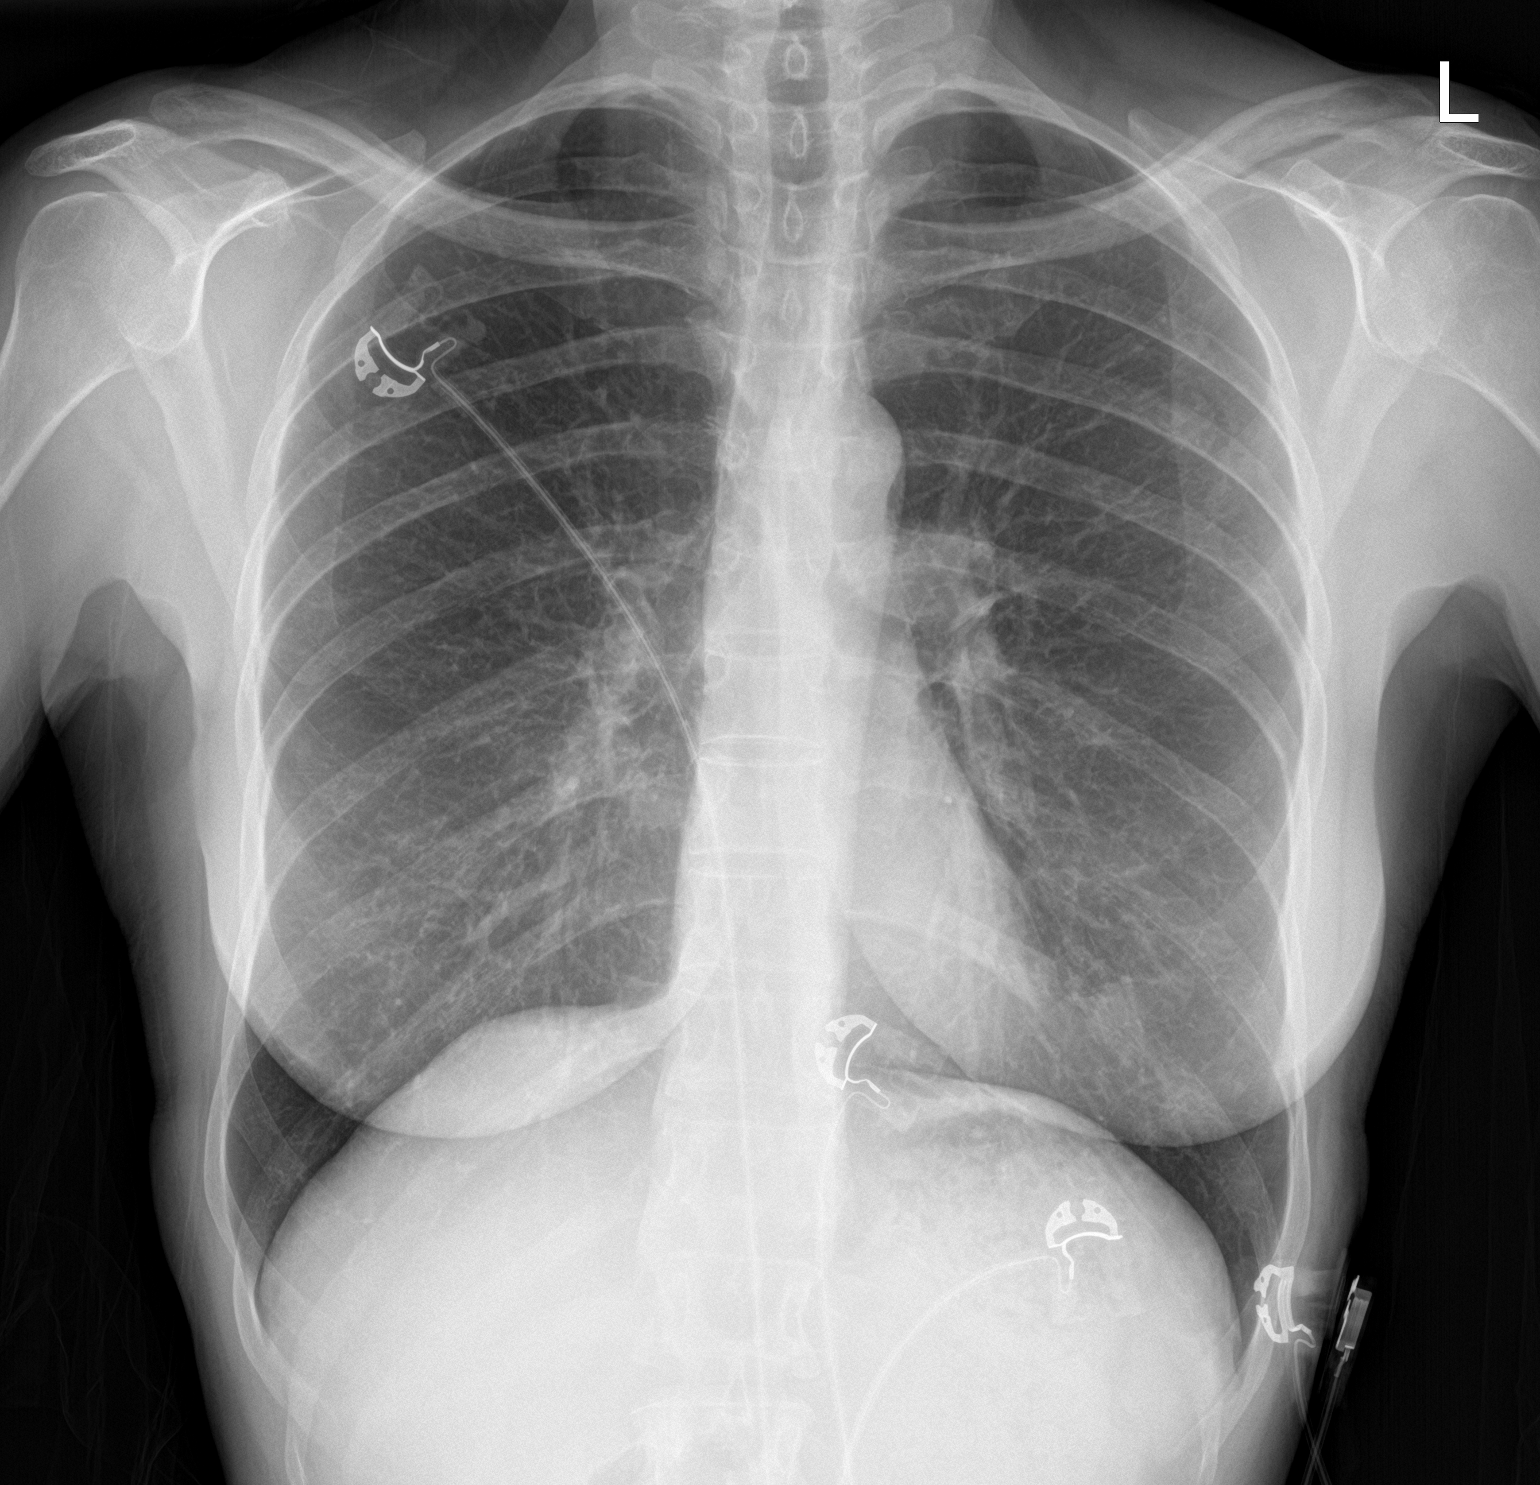

[2 of 2 positions shown; findings below may reference images not displayed]

FINDINGS: The chest is hyperexpanded with attenuation of the pulmonary
vasculature. Lungs clear. Heart size normal. No pneumothorax or
pleural effusion. No acute or focal bony abnormality.
IMPRESSION: The lungs appear emphysematous.  No acute disease.

## 2020-07-07 ENCOUNTER — Ambulatory Visit
Admission: RE | Admit: 2020-07-07 | Discharge: 2020-07-07 | Disposition: A | Discharge status: Home / Self Care | Payer: BC Managed Care – PPO | Source: Ambulatory Visit | Attending: Gastroenterology | Admitting: Gastroenterology

## 2020-07-07 DIAGNOSIS — B182 Chronic viral hepatitis C: Secondary | ICD-10-CM

## 2020-07-07 DIAGNOSIS — R1084 Generalized abdominal pain: Secondary | ICD-10-CM

## 2020-07-07 DIAGNOSIS — R110 Nausea: Secondary | ICD-10-CM

## 2020-07-07 MED ORDER — IOPAMIDOL (ISOVUE-300) INJECTION 61%
100.0000 mL | Freq: Once | INTRAVENOUS | Status: AC | PRN
  Administered 2020-07-07: 80 mL via INTRAVENOUS

## 2020-09-05 IMAGING — MG DIGITAL SCREENING BILAT W/ TOMO W/ CAD
8 series · 9 of 24 positions shown · non-contrast
Comparison: Previous exam(s).

CLINICAL DATA: Screening.

EXAM:
DIGITAL SCREENING BILATERAL MAMMOGRAM WITH TOMO AND CAD

[L CC synth-2D]
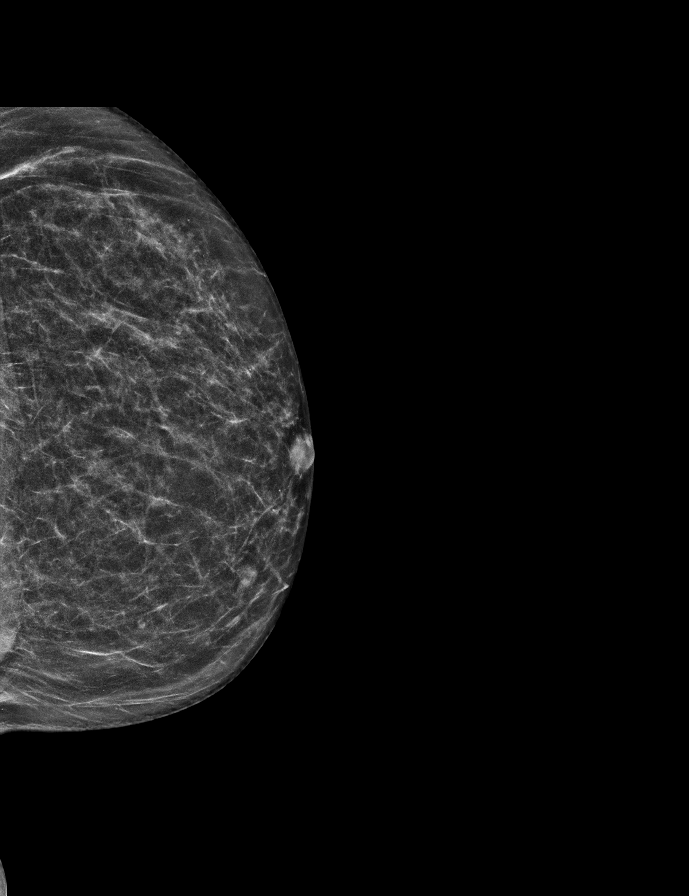

[R CC synth-2D]
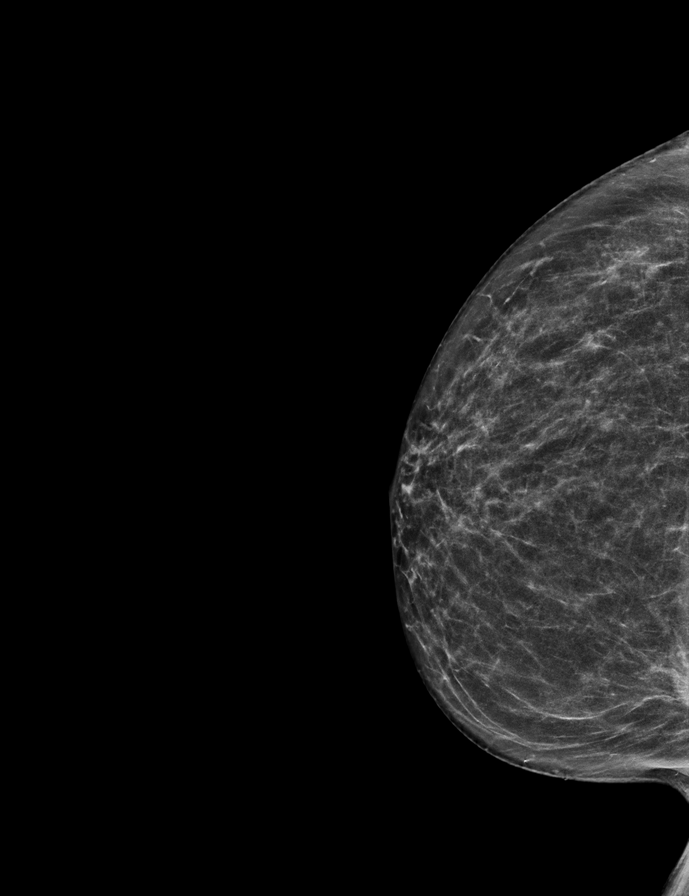

[L MLO synth-2D]
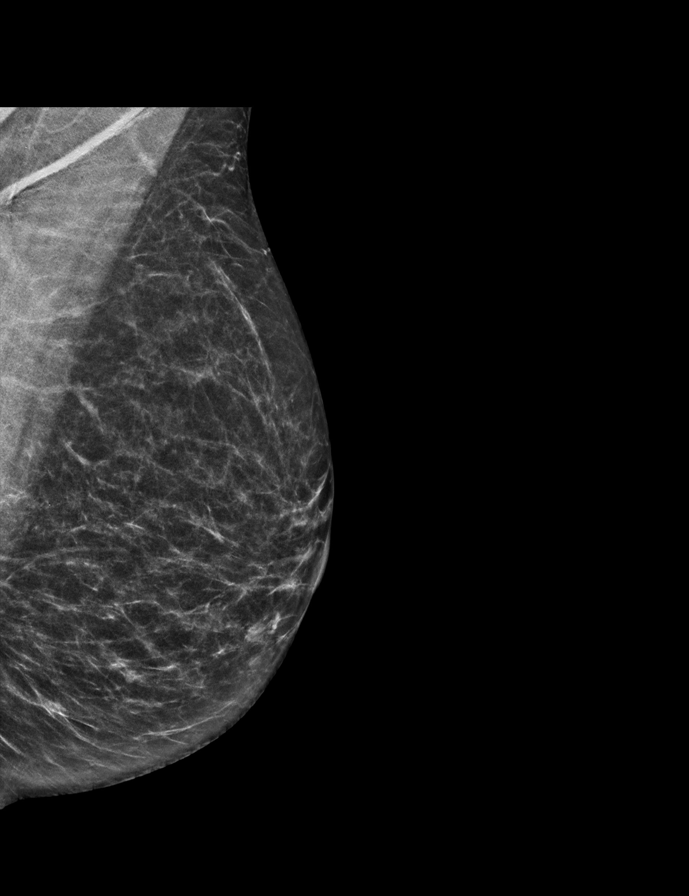

[R MLO synth-2D]
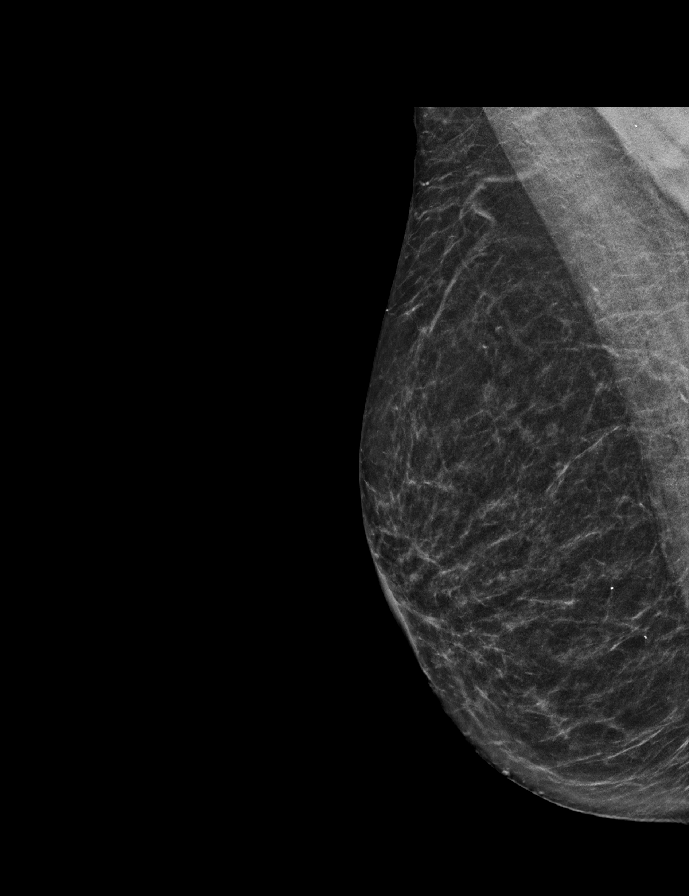

[L CC tomo · 2 of 48 frames shown]
[frame 16/48]
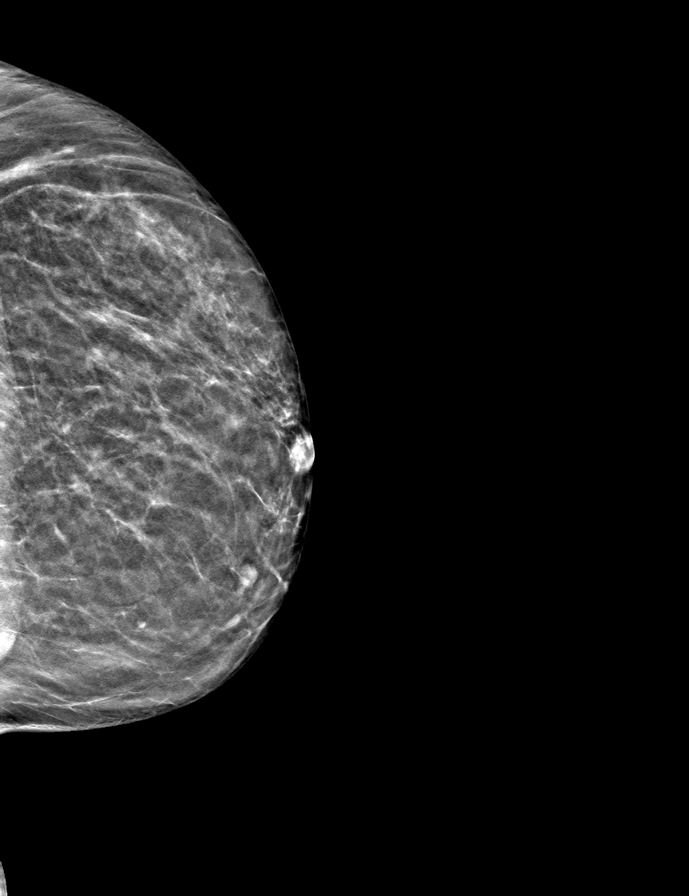
[frame 25/48]
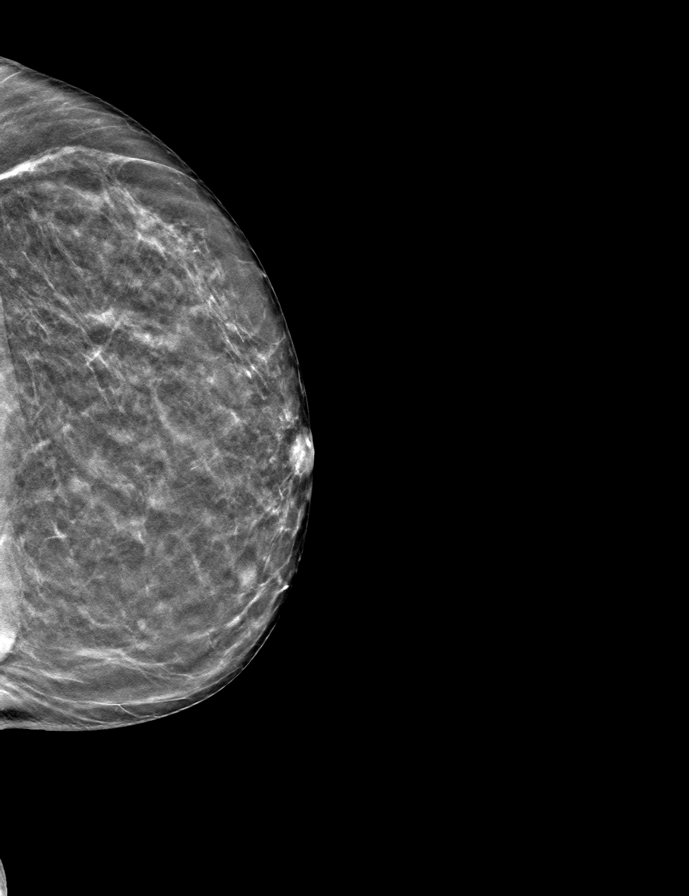

[L MLO tomo · tomo slice 26/51.0]
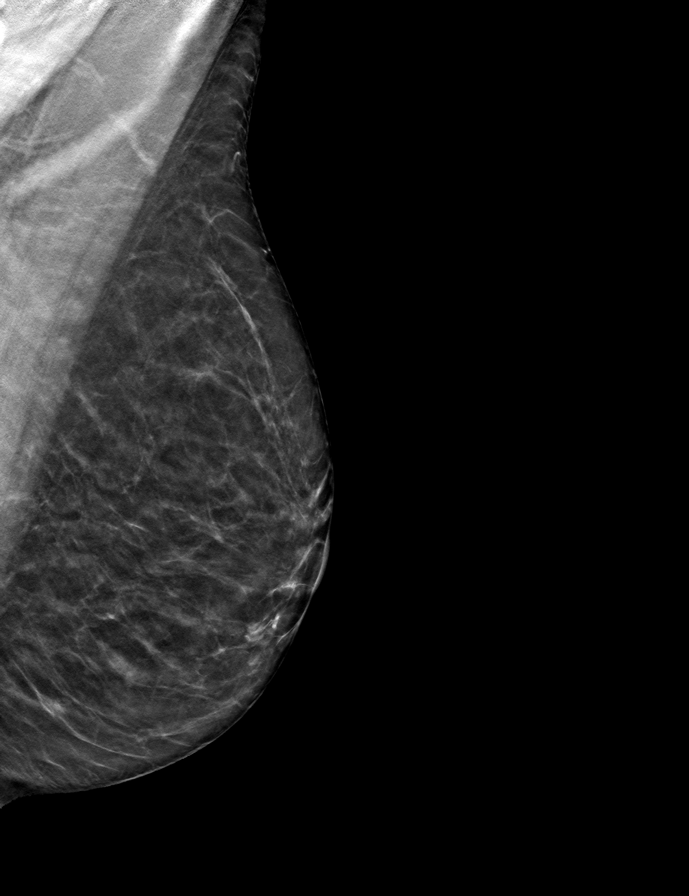

[R MLO tomo · tomo slice 27/54.0]
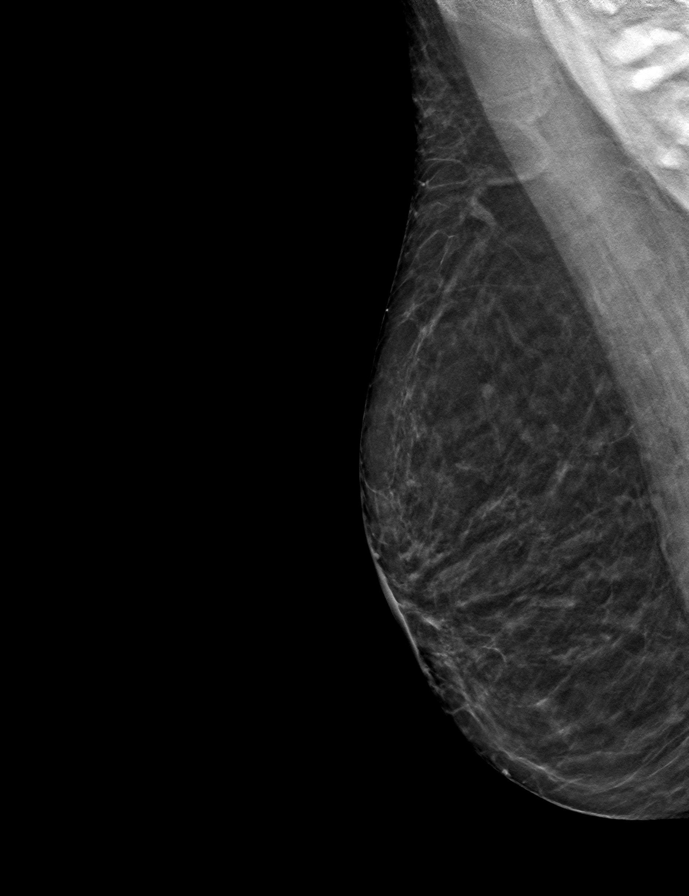

[R CC tomo · tomo slice 27/53.0]
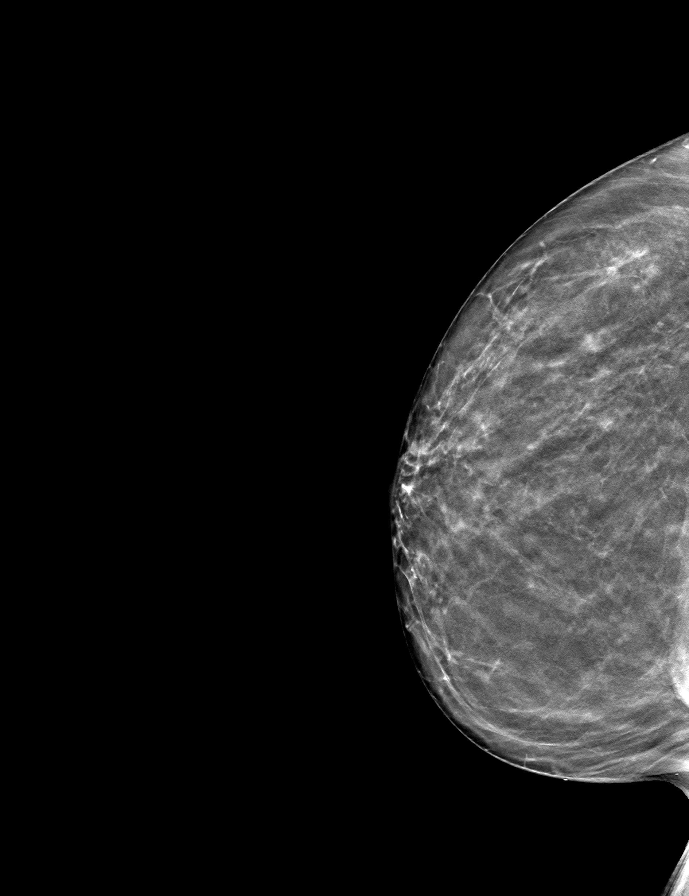

[9 of 24 positions shown; findings below may reference images not displayed]

ACR Breast Density Category b: There are scattered areas of
fibroglandular density.
FINDINGS: There are no findings suspicious for malignancy. Images were
processed with CAD.
IMPRESSION: No mammographic evidence of malignancy. A result letter of this
screening mammogram will be mailed directly to the patient.

RECOMMENDATION:
Screening mammogram in one year. (Code:[id number])

BI-RADS CATEGORY  1: Negative.

## 2022-03-10 DIAGNOSIS — R238 Other skin changes: Secondary | ICD-10-CM | POA: Diagnosis not present

## 2022-11-18 DIAGNOSIS — R69 Illness, unspecified: Secondary | ICD-10-CM | POA: Diagnosis not present

## 2022-11-18 DIAGNOSIS — F509 Eating disorder, unspecified: Secondary | ICD-10-CM | POA: Diagnosis not present

## 2022-11-18 DIAGNOSIS — G3184 Mild cognitive impairment, so stated: Secondary | ICD-10-CM | POA: Diagnosis not present

## 2022-11-18 DIAGNOSIS — F3132 Bipolar disorder, current episode depressed, moderate: Secondary | ICD-10-CM | POA: Diagnosis not present

## 2023-02-24 DIAGNOSIS — F419 Anxiety disorder, unspecified: Secondary | ICD-10-CM | POA: Diagnosis not present

## 2023-02-24 DIAGNOSIS — F3178 Bipolar disorder, in full remission, most recent episode mixed: Secondary | ICD-10-CM | POA: Diagnosis not present

## 2023-03-29 DIAGNOSIS — S52501A Unspecified fracture of the lower end of right radius, initial encounter for closed fracture: Secondary | ICD-10-CM | POA: Diagnosis not present

## 2023-03-29 DIAGNOSIS — W1839XA Other fall on same level, initial encounter: Secondary | ICD-10-CM | POA: Diagnosis not present

## 2023-03-29 DIAGNOSIS — S51011A Laceration without foreign body of right elbow, initial encounter: Secondary | ICD-10-CM | POA: Diagnosis not present

## 2023-03-29 DIAGNOSIS — M79641 Pain in right hand: Secondary | ICD-10-CM | POA: Diagnosis not present

## 2023-03-29 DIAGNOSIS — R930 Abnormal findings on diagnostic imaging of skull and head, not elsewhere classified: Secondary | ICD-10-CM | POA: Diagnosis not present

## 2023-03-29 DIAGNOSIS — I6522 Occlusion and stenosis of left carotid artery: Secondary | ICD-10-CM | POA: Diagnosis not present

## 2023-03-29 DIAGNOSIS — S01419A Laceration without foreign body of unspecified cheek and temporomandibular area, initial encounter: Secondary | ICD-10-CM | POA: Diagnosis not present

## 2023-03-29 DIAGNOSIS — W2209XA Striking against other stationary object, initial encounter: Secondary | ICD-10-CM | POA: Diagnosis not present

## 2023-03-29 DIAGNOSIS — M25531 Pain in right wrist: Secondary | ICD-10-CM | POA: Diagnosis not present

## 2023-03-29 DIAGNOSIS — R55 Syncope and collapse: Secondary | ICD-10-CM | POA: Diagnosis not present

## 2023-03-29 DIAGNOSIS — M25521 Pain in right elbow: Secondary | ICD-10-CM | POA: Diagnosis not present

## 2023-03-29 DIAGNOSIS — I6523 Occlusion and stenosis of bilateral carotid arteries: Secondary | ICD-10-CM | POA: Diagnosis not present

## 2023-03-29 DIAGNOSIS — S02622A Fracture of subcondylar process of left mandible, initial encounter for closed fracture: Secondary | ICD-10-CM | POA: Diagnosis not present

## 2023-03-29 DIAGNOSIS — M8580 Other specified disorders of bone density and structure, unspecified site: Secondary | ICD-10-CM | POA: Diagnosis not present

## 2023-03-29 DIAGNOSIS — Z79899 Other long term (current) drug therapy: Secondary | ICD-10-CM | POA: Diagnosis not present

## 2023-03-29 DIAGNOSIS — S59291A Other physeal fracture of lower end of radius, right arm, initial encounter for closed fracture: Secondary | ICD-10-CM | POA: Diagnosis not present

## 2023-03-29 DIAGNOSIS — Y9389 Activity, other specified: Secondary | ICD-10-CM | POA: Diagnosis not present

## 2023-03-30 DIAGNOSIS — R55 Syncope and collapse: Secondary | ICD-10-CM | POA: Diagnosis not present

## 2023-04-12 DIAGNOSIS — F17200 Nicotine dependence, unspecified, uncomplicated: Secondary | ICD-10-CM | POA: Diagnosis not present

## 2023-04-12 DIAGNOSIS — S02622D Fracture of subcondylar process of left mandible, subsequent encounter for fracture with routine healing: Secondary | ICD-10-CM | POA: Diagnosis not present

## 2023-04-12 DIAGNOSIS — S0181XD Laceration without foreign body of other part of head, subsequent encounter: Secondary | ICD-10-CM | POA: Diagnosis not present

## 2023-04-27 DIAGNOSIS — S52509A Unspecified fracture of the lower end of unspecified radius, initial encounter for closed fracture: Secondary | ICD-10-CM | POA: Diagnosis not present

## 2023-04-27 DIAGNOSIS — W19XXXA Unspecified fall, initial encounter: Secondary | ICD-10-CM | POA: Diagnosis not present

## 2023-04-27 DIAGNOSIS — S62101A Fracture of unspecified carpal bone, right wrist, initial encounter for closed fracture: Secondary | ICD-10-CM | POA: Diagnosis not present

## 2023-04-27 DIAGNOSIS — S62101D Fracture of unspecified carpal bone, right wrist, subsequent encounter for fracture with routine healing: Secondary | ICD-10-CM | POA: Diagnosis not present

## 2023-05-03 DIAGNOSIS — R55 Syncope and collapse: Secondary | ICD-10-CM | POA: Diagnosis not present

## 2023-05-03 DIAGNOSIS — J449 Chronic obstructive pulmonary disease, unspecified: Secondary | ICD-10-CM | POA: Diagnosis not present

## 2023-05-03 DIAGNOSIS — F17200 Nicotine dependence, unspecified, uncomplicated: Secondary | ICD-10-CM | POA: Diagnosis not present

## 2023-05-03 DIAGNOSIS — Z0000 Encounter for general adult medical examination without abnormal findings: Secondary | ICD-10-CM | POA: Diagnosis not present

## 2023-05-03 DIAGNOSIS — F3181 Bipolar II disorder: Secondary | ICD-10-CM | POA: Diagnosis not present

## 2023-05-03 DIAGNOSIS — Z8781 Personal history of (healed) traumatic fracture: Secondary | ICD-10-CM | POA: Diagnosis not present

## 2023-05-03 DIAGNOSIS — Z9181 History of falling: Secondary | ICD-10-CM | POA: Diagnosis not present

## 2023-05-03 DIAGNOSIS — I700 Atherosclerosis of aorta: Secondary | ICD-10-CM | POA: Diagnosis not present

## 2023-05-18 DIAGNOSIS — S02622D Fracture of subcondylar process of left mandible, subsequent encounter for fracture with routine healing: Secondary | ICD-10-CM | POA: Diagnosis not present

## 2023-05-24 DIAGNOSIS — F419 Anxiety disorder, unspecified: Secondary | ICD-10-CM | POA: Diagnosis not present

## 2023-05-24 DIAGNOSIS — F3178 Bipolar disorder, in full remission, most recent episode mixed: Secondary | ICD-10-CM | POA: Diagnosis not present

## 2023-09-14 DIAGNOSIS — F419 Anxiety disorder, unspecified: Secondary | ICD-10-CM | POA: Diagnosis not present

## 2023-09-14 DIAGNOSIS — F3178 Bipolar disorder, in full remission, most recent episode mixed: Secondary | ICD-10-CM | POA: Diagnosis not present

## 2023-10-12 DIAGNOSIS — F3178 Bipolar disorder, in full remission, most recent episode mixed: Secondary | ICD-10-CM | POA: Diagnosis not present

## 2023-10-12 DIAGNOSIS — F419 Anxiety disorder, unspecified: Secondary | ICD-10-CM | POA: Diagnosis not present

## 2023-11-17 DIAGNOSIS — F419 Anxiety disorder, unspecified: Secondary | ICD-10-CM | POA: Diagnosis not present

## 2023-11-17 DIAGNOSIS — F3178 Bipolar disorder, in full remission, most recent episode mixed: Secondary | ICD-10-CM | POA: Diagnosis not present

## 2024-01-24 DIAGNOSIS — D509 Iron deficiency anemia, unspecified: Secondary | ICD-10-CM | POA: Diagnosis not present

## 2024-02-15 DIAGNOSIS — K635 Polyp of colon: Secondary | ICD-10-CM | POA: Diagnosis not present

## 2024-02-15 DIAGNOSIS — Z860100 Personal history of colon polyps, unspecified: Secondary | ICD-10-CM | POA: Diagnosis not present

## 2024-02-15 DIAGNOSIS — K529 Noninfective gastroenteritis and colitis, unspecified: Secondary | ICD-10-CM | POA: Diagnosis not present

## 2024-02-15 DIAGNOSIS — Z09 Encounter for follow-up examination after completed treatment for conditions other than malignant neoplasm: Secondary | ICD-10-CM | POA: Diagnosis not present

## 2024-02-15 DIAGNOSIS — K6389 Other specified diseases of intestine: Secondary | ICD-10-CM | POA: Diagnosis not present

## 2024-02-15 DIAGNOSIS — D122 Benign neoplasm of ascending colon: Secondary | ICD-10-CM | POA: Diagnosis not present

## 2024-02-15 DIAGNOSIS — K5520 Angiodysplasia of colon without hemorrhage: Secondary | ICD-10-CM | POA: Diagnosis not present

## 2024-02-20 DIAGNOSIS — K529 Noninfective gastroenteritis and colitis, unspecified: Secondary | ICD-10-CM | POA: Diagnosis not present

## 2024-02-20 DIAGNOSIS — K635 Polyp of colon: Secondary | ICD-10-CM | POA: Diagnosis not present

## 2024-02-20 DIAGNOSIS — D122 Benign neoplasm of ascending colon: Secondary | ICD-10-CM | POA: Diagnosis not present

## 2024-04-06 DIAGNOSIS — F319 Bipolar disorder, unspecified: Secondary | ICD-10-CM | POA: Diagnosis not present

## 2024-04-06 DIAGNOSIS — J449 Chronic obstructive pulmonary disease, unspecified: Secondary | ICD-10-CM | POA: Diagnosis not present

## 2024-04-08 DIAGNOSIS — H25813 Combined forms of age-related cataract, bilateral: Secondary | ICD-10-CM | POA: Diagnosis not present

## 2024-04-08 DIAGNOSIS — D23121 Other benign neoplasm of skin of left upper eyelid, including canthus: Secondary | ICD-10-CM | POA: Diagnosis not present

## 2024-04-08 DIAGNOSIS — H04123 Dry eye syndrome of bilateral lacrimal glands: Secondary | ICD-10-CM | POA: Diagnosis not present

## 2024-04-08 DIAGNOSIS — H43393 Other vitreous opacities, bilateral: Secondary | ICD-10-CM | POA: Diagnosis not present

## 2024-04-09 DIAGNOSIS — F3178 Bipolar disorder, in full remission, most recent episode mixed: Secondary | ICD-10-CM | POA: Diagnosis not present

## 2024-04-09 DIAGNOSIS — F419 Anxiety disorder, unspecified: Secondary | ICD-10-CM | POA: Diagnosis not present

## 2024-05-02 ENCOUNTER — Other Ambulatory Visit: Payer: Self-pay | Admitting: Gastroenterology

## 2024-05-02 DIAGNOSIS — D4819 Other specified neoplasm of uncertain behavior of connective and other soft tissue: Secondary | ICD-10-CM | POA: Diagnosis not present

## 2024-05-03 ENCOUNTER — Other Ambulatory Visit: Payer: Self-pay | Admitting: Ophthalmology

## 2024-05-03 DIAGNOSIS — L919 Hypertrophic disorder of the skin, unspecified: Secondary | ICD-10-CM | POA: Diagnosis not present

## 2024-05-06 LAB — DERMATOLOGY PATHOLOGY

## 2024-05-20 DIAGNOSIS — Z131 Encounter for screening for diabetes mellitus: Secondary | ICD-10-CM | POA: Diagnosis not present

## 2024-05-20 DIAGNOSIS — Z136 Encounter for screening for cardiovascular disorders: Secondary | ICD-10-CM | POA: Diagnosis not present

## 2024-05-29 ENCOUNTER — Encounter (HOSPITAL_COMMUNITY): Payer: Self-pay | Admitting: Gastroenterology

## 2024-05-29 ENCOUNTER — Other Ambulatory Visit: Payer: Self-pay | Admitting: Physician Assistant

## 2024-05-29 DIAGNOSIS — Z1231 Encounter for screening mammogram for malignant neoplasm of breast: Secondary | ICD-10-CM

## 2024-06-04 ENCOUNTER — Other Ambulatory Visit: Payer: Self-pay

## 2024-06-04 ENCOUNTER — Ambulatory Visit (HOSPITAL_COMMUNITY): Admitting: Anesthesiology

## 2024-06-04 ENCOUNTER — Ambulatory Visit (HOSPITAL_COMMUNITY)
Admission: RE | Admit: 2024-06-04 | Discharge: 2024-06-04 | Disposition: A | Discharge status: Home / Self Care | Attending: Gastroenterology | Admitting: Gastroenterology

## 2024-06-04 ENCOUNTER — Encounter (HOSPITAL_COMMUNITY)
Admission: RE | Disposition: A | Discharge status: Home / Self Care | Payer: Self-pay | Source: Home / Self Care | Attending: Gastroenterology

## 2024-06-04 DIAGNOSIS — F17210 Nicotine dependence, cigarettes, uncomplicated: Secondary | ICD-10-CM | POA: Diagnosis not present

## 2024-06-04 DIAGNOSIS — D120 Benign neoplasm of cecum: Secondary | ICD-10-CM | POA: Diagnosis not present

## 2024-06-04 DIAGNOSIS — F418 Other specified anxiety disorders: Secondary | ICD-10-CM | POA: Diagnosis not present

## 2024-06-04 DIAGNOSIS — D125 Benign neoplasm of sigmoid colon: Secondary | ICD-10-CM | POA: Diagnosis not present

## 2024-06-04 DIAGNOSIS — K6389 Other specified diseases of intestine: Secondary | ICD-10-CM | POA: Diagnosis not present

## 2024-06-04 DIAGNOSIS — K630 Abscess of intestine: Secondary | ICD-10-CM | POA: Diagnosis not present

## 2024-06-04 DIAGNOSIS — K635 Polyp of colon: Secondary | ICD-10-CM

## 2024-06-04 DIAGNOSIS — Z860100 Personal history of colon polyps, unspecified: Secondary | ICD-10-CM

## 2024-06-04 DIAGNOSIS — Z860101 Personal history of adenomatous and serrated colon polyps: Secondary | ICD-10-CM

## 2024-06-04 DIAGNOSIS — Z1211 Encounter for screening for malignant neoplasm of colon: Secondary | ICD-10-CM

## 2024-06-04 DIAGNOSIS — D126 Benign neoplasm of colon, unspecified: Secondary | ICD-10-CM | POA: Diagnosis not present

## 2024-06-04 DIAGNOSIS — K5520 Angiodysplasia of colon without hemorrhage: Secondary | ICD-10-CM | POA: Insufficient documentation

## 2024-06-04 DIAGNOSIS — K640 First degree hemorrhoids: Secondary | ICD-10-CM | POA: Diagnosis not present

## 2024-06-04 HISTORY — PX: COLONOSCOPY: SHX5424

## 2024-06-04 SURGERY — COLONOSCOPY
Anesthesia: Monitor Anesthesia Care

## 2024-06-04 MED ORDER — DEXMEDETOMIDINE HCL IN NACL 80 MCG/20ML IV SOLN
INTRAVENOUS | Status: DC | PRN
  Administered 2024-06-04: 4 ug via INTRAVENOUS

## 2024-06-04 MED ORDER — PROPOFOL 1000 MG/100ML IV EMUL
INTRAVENOUS | Status: AC
  Filled 2024-06-04: qty 100

## 2024-06-04 MED ORDER — PROPOFOL 500 MG/50ML IV EMUL
INTRAVENOUS | Status: DC | PRN
  Administered 2024-06-04: 75 ug/kg/min via INTRAVENOUS
  Administered 2024-06-04 (×2): 50 mg via INTRAVENOUS
  Administered 2024-06-04: 100 mg via INTRAVENOUS

## 2024-06-04 MED ORDER — PROPOFOL 10 MG/ML IV BOLUS
INTRAVENOUS | Status: AC
  Filled 2024-06-04: qty 20

## 2024-06-04 MED ORDER — SODIUM CHLORIDE 0.9 % IV SOLN: INTRAVENOUS | Status: DC

## 2024-06-04 NOTE — Interval H&P Note (Signed)
 History and Physical Interval Note:  06/04/2024 10:09 AM  Brandi Vazquez  has presented today for surgery, with the diagnosis of History of colon polyps.  The various methods of treatment have been discussed with the patient and family. After consideration of risks, benefits and other options for treatment, the patient has consented to  Procedure(s): COLONOSCOPY, WITH ARGON PLASMA COAGULATION (N/A) as a surgical intervention.  The patient's history has been reviewed, patient examined, no change in status, stable for surgery.  I have reviewed the patient's chart and labs.  Questions were answered to the patient's satisfaction.     [name]

## 2024-06-04 NOTE — Discharge Instructions (Addendum)
YOU HAD AN ENDOSCOPIC PROCEDURE TODAY: Refer to the procedure report and other information in the discharge instructions given to you for any specific questions about what was found during the examination. If this information does not answer your questions, please call [facility] office at [phone] to clarify.   YOU SHOULD EXPECT: Some feelings of bloating in the abdomen. Passage of more gas than usual. Walking can help get rid of the air that was put into your GI tract during the procedure and reduce the bloating. If you had a lower endoscopy (such as a colonoscopy or flexible sigmoidoscopy) you may notice spotting of blood in your stool or on the toilet paper. Some abdominal soreness may be present for a day or two, also.  DIET: Your first meal following the procedure should be a light meal and then it is ok to progress to your normal diet. A half-sandwich or bowl of soup is an example of a good first meal. Heavy or fried foods are harder to digest and may make you feel nauseous or bloated. Drink plenty of fluids but you should avoid alcoholic beverages for 24 hours. If you had a esophageal dilation, please see attached instructions for diet.    ACTIVITY: Your care partner should take you home directly after the procedure. You should plan to take it easy, moving slowly for the rest of the day. You can resume normal activity the day after the procedure however YOU SHOULD NOT DRIVE, use power tools, machinery or perform tasks that involve climbing or major physical exertion for 24 hours (because of the sedation medicines used during the test).   SYMPTOMS TO REPORT IMMEDIATELY: A gastroenterologist can be reached at any hour. Please call [phone]  for any of the following symptoms:  Following lower endoscopy (colonoscopy, flexible sigmoidoscopy) Excessive amounts of blood in the stool  Significant tenderness, worsening of abdominal pains  Swelling of the abdomen that is new, acute  Fever of 100  or higher   FOLLOW UP:  If any biopsies were taken you will be contacted by phone or by letter within the next 1-3 weeks. Call [phone]  if you have not heard about the biopsies in 3 weeks.  Please also call with any specific questions about appointments or follow up tests. YOU HAD AN ENDOSCOPIC PROCEDURE TODAY: Refer to the procedure report and other information in the discharge instructions given to you for any specific questions about what was found during the examination. If this information does not answer your questions, please call [facility] office at [phone] to clarify.  

## 2024-06-04 NOTE — Anesthesia Preprocedure Evaluation (Addendum)
 Anesthesia Evaluation  Patient identified by MRN, date of birth, ID band Patient awake    Reviewed: Allergy & Precautions, H&P , NPO status , Patient's Chart, lab work & pertinent test results  Airway Mallampati: I  TM Distance: >3 FB Neck ROM: Full    Dental no notable dental hx. (+) Dental Advisory Given, Edentulous Upper, Upper Dentures,    Pulmonary neg pulmonary ROS, Current Smoker and Patient abstained from smoking.   Pulmonary exam normal breath sounds clear to auscultation       Cardiovascular Exercise Tolerance: Good negative cardio ROS Normal cardiovascular exam Rhythm:Regular Rate:Normal     Neuro/Psych  PSYCHIATRIC DISORDERS Anxiety Depression Bipolar Disorder   negative neurological ROS  negative psych ROS   GI/Hepatic negative GI ROS, Neg liver ROS,,,(+) Hepatitis -  Endo/Other  negative endocrine ROS    Renal/GU negative Renal ROS  negative genitourinary   Musculoskeletal negative musculoskeletal ROS (+)    Abdominal   Peds negative pediatric ROS (+)  Hematology negative hematology ROS (+)   Anesthesia Other Findings   Reproductive/Obstetrics negative OB ROS                              Anesthesia Physical Anesthesia Plan  ASA: 2  Anesthesia Plan: MAC   Post-op Pain Management: Minimal or no pain anticipated   Induction: Intravenous  PONV Risk Score and Plan: 1  Airway Management Planned: Mask, Natural Airway and Simple Face Mask  Additional Equipment: None  Intra-op Plan:   Post-operative Plan:   Informed Consent: I have reviewed the patients History and Physical, chart, labs and discussed the procedure including the risks, benefits and alternatives for the proposed anesthesia with the patient or authorized representative who has indicated his/her understanding and acceptance.       Plan Discussed with: Anesthesiologist and CRNA  Anesthesia Plan  Comments:          Anesthesia Quick Evaluation

## 2024-06-04 NOTE — Transfer of Care (Signed)
 Immediate Anesthesia Transfer of Care Note  Patient: Brandi Vazquez  Procedure(s) Performed: COLONOSCOPY, WITH ARGON PLASMA COAGULATION  Patient Location: PACU and Endoscopy Unit  Anesthesia Type:MAC  Level of Consciousness: awake, alert , oriented, and patient cooperative  Airway & Oxygen Therapy: Patient Spontanous Breathing  Post-op Assessment: Report given to RN and Post -op Vital signs reviewed and stable  Post vital signs: Reviewed and stable  Last Vitals:  Vitals Value Taken Time  BP 111/43 06/04/24 10:47  Temp    Pulse 77 06/04/24 10:49  Resp 19 06/04/24 10:49  SpO2 99 % 06/04/24 10:49  Vitals shown include unfiled device data.  Last Pain:  Vitals:   06/04/24 0903  TempSrc: Temporal  PainSc: 0-No pain         Complications: No notable events documented.

## 2024-06-04 NOTE — Anesthesia Procedure Notes (Addendum)
 Procedure Name: MAC Date/Time: 06/04/2024 10:16 AM  Performed by: Nada Corean CROME, CRNAPre-anesthesia Checklist: Patient identified, Emergency Drugs available, Suction available, Patient being monitored and Timeout performed Patient Re-evaluated:Patient Re-evaluated prior to induction Oxygen Delivery Method: Simple face mask Preoxygenation: Pre-oxygenation with 100% oxygen Induction Type: IV induction Placement Confirmation: positive ETCO2 Dental Injury: Teeth and Oropharynx as per pre-operative assessment

## 2024-06-04 NOTE — Consult Note (Signed)
 HPI: Brandi Vazquez is a 68 y.o. female with history of adenomatous polyps last seen on colonoscopy in April 2025 were a sessile serrated adenoma (SSA) was removed from the ascending colon and an SSA was seen on biopsy of a nodular area of the ileocecal valve (ICV). Repeat colonoscopy planned for now to do argon plasma coagulation (APC) of the ICV if needed and evaluate for residual polyp at that area. Denies abdominal pain.   Past Medical History:  Diagnosis Date   Anxiety    Bipolar 1 disorder ([facility])    Depression    Hepatitis C    Pt reports that she took the meds to reverse it.     Past Surgical History:  Procedure Laterality Date   ABDOMINAL HYSTERECTOMY     FOOT SURGERY      Prior to Admission medications   Medication Sig Start Date End Date Taking? Authorizing Provider  imipramine (TOFRANIL) 50 MG tablet Take 100 mg by mouth at bedtime. 10/27/19  Yes [provider]  QUEtiapine  (SEROQUEL  XR) 400 MG 24 hr tablet Take 400 mg by mouth at bedtime.   Yes [provider]  ARIPiprazole  (ABILIFY ) 10 MG tablet Take 10 mg by mouth at bedtime.    [provider]  buPROPion  (WELLBUTRIN  XL) 150 MG 24 hr tablet Take 150 mg by mouth daily. Patient not taking: Reported on 05/29/2024    [provider]  citalopram  (CELEXA ) 40 MG tablet Take 40 mg by mouth daily.    [provider]  clonazePAM  (KLONOPIN ) 1 MG tablet Take 1 mg by mouth 2 (two) times daily.    [provider]  HYDROcodone -acetaminophen  (NORCO/VICODIN) 5-325 MG per tablet Take 2 tablets by mouth every 4 (four) hours as needed. 02/09/14   Sofia, Leslie K, PA-C  Lurasidone HCl (LATUDA PO) Take by mouth.    [provider]  nitrofurantoin , macrocrystal-monohydrate, (MACROBID ) 100 MG capsule Take 1 capsule (100 mg total) by mouth 2 (two) times daily. 10/18/12   Bonk, Tanja, MD  simvastatin (ZOCOR) 10 MG tablet SMARTSIG:1 Tablet(s) By Mouth Every Evening 10/30/19    [provider]  zolpidem  (AMBIEN  CR) 12.5 MG CR tablet Take 12.5 mg by mouth at bedtime.    [provider]    Scheduled Meds: Continuous Infusions:  sodium chloride      PRN Meds:.  Allergies as of 05/02/2024   (No Known Allergies)    Family History  Problem Relation Age of Onset   Breast cancer Neg Hx     Social History   Socioeconomic History   Marital status: Married    Spouse name: Not on file   Number of children: Not on file   Years of education: Not on file   Highest education level: Not on file  Occupational History   Not on file  Tobacco Use   Smoking status: Every Day    Current packs/day: 0.50    Types: Cigarettes   Smokeless tobacco: Not on file  Substance and Sexual Activity   Alcohol use: Yes    Comment: occasionally   Drug use: Yes    Types: Marijuana    Comment: couple times a week   Sexual activity: Not on file  Other Topics Concern   Not on file  Social History Narrative   Not on file   Social Drivers of Health   Financial Resource Strain: Not on file  Food Insecurity: Low Risk  (03/29/2023)   Received from [facility]  Hunger Vital Sign    Within the past 12 months, you worried that your food would run out before you got money to buy more: Never true    Within the past 12 months, the food you bought just didn't last and you didn't have money to get more. : Never true  Transportation Needs: Not on file (03/29/2023)  Physical Activity: Not on file  Stress: Not on file  Social Connections: Not on file  Intimate Partner Violence: Not on file    Review of Systems: All negative except as stated above in HPI.  Physical Exam: Vital signs: Vitals:   06/04/24 0903  BP: (!) 148/67  Pulse: 74  Resp: 20  Temp: 98.2 F (36.8 C)  SpO2: 99%     General:   Alert,  thin, pleasant and cooperative in NAD Head: normocephalic, atraumatic Eyes: anicteric sclera ENT: oropharynx clear Neck: supple, nontender Lungs:  Clear  throughout to auscultation.   No wheezes, crackles, or rhonchi. No acute distress. Heart:  Regular rate and rhythm; no murmurs, clicks, rubs,  or gallops. Abdomen: soft, nontender, nondistended, +BS  Rectal:  Deferred Ext: no edema  GI:  Lab Results: No results for input(s): WBC, HGB, HCT, PLT in the last 72 hours. BMET No results for input(s): NA, K, CL, CO2, GLUCOSE, BUN, CREATININE, CALCIUM in the last 72 hours. LFT No results for input(s): PROT, ALBUMIN, AST, ALT, ALKPHOS, BILITOT, BILIDIR, IBILI in the last 72 hours. PT/INR No results for input(s): LABPROT, INR in the last 72 hours.   Studies/Results: No results found.  Impression/Plan: Adenomatous polyps as stated above in need of repeat today to do APC of ICV if residual polyp noted. Propofol  sedation by anesthesia. Informed consent obtained.    LOS: 0 days   [name]  06/04/2024, 10:03 AM  Questions please call [phone]

## 2024-06-04 NOTE — Op Note (Signed)
 [facility] Patient Name: Brandi Vazquez Procedure Date: 06/04/2024 MRN: [id number] Attending MD: [name] , MD, [id number] Date of Birth: 24-May-1956 CSN: [id number] Age: 68 Admit Type: Outpatient Procedure:                Colonoscopy Indications:              Last colonoscopy: April 2025, Adenomatous polyps in                            the colon Providers:                Jerrell KYM Sol, MD, Jacquelyn Jaci Pierce,                            RN, Felice Sar, Technician Referring MD:             Garnette Gleason Medicines:                Propofol  per Anesthesia, Monitored Anesthesia Care Complications:            No immediate complications. Estimated Blood Loss:     Estimated blood loss was minimal. Procedure:                Pre-Anesthesia Assessment:                           - Prior to the procedure, a History and Physical                            was performed, and patient medications and                            allergies were reviewed. The patient's tolerance of                            previous anesthesia was also reviewed. The risks                            and benefits of the procedure and the sedation                            options and risks were discussed with the patient.                            All questions were answered, and informed consent                            was obtained. Prior Anticoagulants: The patient has                            taken no anticoagulant or antiplatelet agents. ASA                            Grade Assessment: II - A patient with mild systemic  disease. After reviewing the risks and benefits,                            the patient was deemed in satisfactory condition to                            undergo the procedure.                           After obtaining informed consent, the colonoscope                            was passed under direct vision. Throughout the                             procedure, the patient's blood pressure, pulse, and                            oxygen saturations were monitored continuously. The                            PCF-HQ190L ([id number]) Olympus colonoscope was                            introduced through the anus and advanced to the the                            cecum, identified by appendiceal orifice and                            ileocecal valve. The quality of the bowel                            preparation was fair. The colonoscopy was performed                            with difficulty due to fair prep. Successful                            completion of the procedure was aided by lavage.                            The patient tolerated the procedure. Scope In: 10:18:59 AM Scope Out: 10:41:51 AM Scope Withdrawal Time: 0 hours 17 minutes 10 seconds  Total Procedure Duration: 0 hours 22 minutes 52 seconds  Findings:      The perianal and digital rectal examinations were normal.      A 2 mm polyp was found in the sigmoid colon. The polyp was semi-sessile.       The polyp was removed with a cold biopsy forceps. Resection and       retrieval were complete. Estimated blood loss was minimal.      A 4 mm polyp was found in the ileocecal valve. The polyp was       semi-sessile. The polyp was removed with a cold  biopsy forceps.       Resection and retrieval were complete. Estimated blood loss was minimal.      A localized area of mildly nodular mucosa was found at the ileocecal       valve. Biopsies were taken with a cold forceps for histology. Estimated       blood loss was minimal.      A single medium-sized angiodysplastic lesion was found in the ascending       colon.      Internal hemorrhoids were found during endoscopy. The hemorrhoids were       small and Grade I (internal hemorrhoids that do not prolapse).      The terminal ileum appeared normal. Impression:               - Preparation of the colon was fair.                            - One 2 mm polyp in the sigmoid colon, removed with                            a cold biopsy forceps. Resected and retrieved.                           - One 4 mm polyp at the ileocecal valve, removed                            with a cold biopsy forceps. Resected and retrieved.                           - Nodular mucosa at the ileocecal valve. Biopsied.                           - A single colonic angiodysplastic lesion.                           - Internal hemorrhoids.                           - The examined portion of the ileum was normal. Moderate Sedation:      N/A - MAC procedure Recommendation:           - Patient has a contact number available for                            emergencies. The signs and symptoms of potential                            delayed complications were discussed with the                            patient. Return to normal activities tomorrow.                            Written discharge instructions were provided to the  patient.                           - High fiber diet.                           - Await pathology results.                           - Repeat colonoscopy for surveillance based on                            pathology results. Procedure Code(s):        --- Professional ---                           [phone], Colonoscopy, flexible; with biopsy, single                            or multiple Diagnosis Code(s):        --- Professional ---                           K63.89, Other specified diseases of intestine                           D12.6, Benign neoplasm of colon, unspecified                           D12.5, Benign neoplasm of sigmoid colon                           D12.0, Benign neoplasm of cecum                           K64.0, First degree hemorrhoids                           K55.20, Angiodysplasia of colon without hemorrhage CPT copyright 2022 [employer]. All rights  reserved. The codes documented in this report are preliminary and upon coder review may  be revised to meet current compliance requirements. [name], MD 06/04/2024 10:57:54 AM This report has been signed electronically. Number of Addenda: 0

## 2024-06-04 NOTE — H&P (View-Only) (Signed)
 HPI: Brandi Vazquez is a 68 y.o. female with history of adenomatous polyps last seen on colonoscopy in April 2025 were a sessile serrated adenoma (SSA) was removed from the ascending colon and an SSA was seen on biopsy of a nodular area of the ileocecal valve (ICV). Repeat colonoscopy planned for now to do argon plasma coagulation (APC) of the ICV if needed and evaluate for residual polyp at that area. Denies abdominal pain.   Past Medical History:  Diagnosis Date   Anxiety    Bipolar 1 disorder (HCC)    Depression    Hepatitis C    Pt reports that she took the meds to reverse it.     Past Surgical History:  Procedure Laterality Date   ABDOMINAL HYSTERECTOMY     FOOT SURGERY      Prior to Admission medications   Medication Sig Start Date End Date Taking? Authorizing Provider  imipramine (TOFRANIL) 50 MG tablet Take 100 mg by mouth at bedtime. 10/27/19  Yes [provider]  QUEtiapine  (SEROQUEL  XR) 400 MG 24 hr tablet Take 400 mg by mouth at bedtime.   Yes [provider]  ARIPiprazole  (ABILIFY ) 10 MG tablet Take 10 mg by mouth at bedtime.    [provider]  buPROPion  (WELLBUTRIN  XL) 150 MG 24 hr tablet Take 150 mg by mouth daily. Patient not taking: Reported on 05/29/2024    [provider]  citalopram  (CELEXA ) 40 MG tablet Take 40 mg by mouth daily.    [provider]  clonazePAM  (KLONOPIN ) 1 MG tablet Take 1 mg by mouth 2 (two) times daily.    [provider]  HYDROcodone -acetaminophen  (NORCO/VICODIN) 5-325 MG per tablet Take 2 tablets by mouth every 4 (four) hours as needed. 02/09/14   Sofia, Leslie K, PA-C  Lurasidone HCl (LATUDA PO) Take by mouth.    [provider]  nitrofurantoin , macrocrystal-monohydrate, (MACROBID ) 100 MG capsule Take 1 capsule (100 mg total) by mouth 2 (two) times daily. 10/18/12   Bonk, Tanja, MD  simvastatin (ZOCOR) 10 MG tablet SMARTSIG:1 Tablet(s) By Mouth Every Evening 10/30/19    [provider]  zolpidem  (AMBIEN  CR) 12.5 MG CR tablet Take 12.5 mg by mouth at bedtime.    [provider]    Scheduled Meds: Continuous Infusions:  sodium chloride      PRN Meds:.  Allergies as of 05/02/2024   (No Known Allergies)    Family History  Problem Relation Age of Onset   Breast cancer Neg Hx     Social History   Socioeconomic History   Marital status: Married    Spouse name: Not on file   Number of children: Not on file   Years of education: Not on file   Highest education level: Not on file  Occupational History   Not on file  Tobacco Use   Smoking status: Every Day    Current packs/day: 0.50    Types: Cigarettes   Smokeless tobacco: Not on file  Substance and Sexual Activity   Alcohol use: Yes    Comment: occasionally   Drug use: Yes    Types: Marijuana    Comment: couple times a week   Sexual activity: Not on file  Other Topics Concern   Not on file  Social History Narrative   Not on file   Social Drivers of Health   Financial Resource Strain: Not on file  Food Insecurity: Low Risk  (03/29/2023)   Received from [facility]  Hunger Vital Sign    Within the past 12 months, you worried that your food would run out before you got money to buy more: Never true    Within the past 12 months, the food you bought just didn't last and you didn't have money to get more. : Never true  Transportation Needs: Not on file (03/29/2023)  Physical Activity: Not on file  Stress: Not on file  Social Connections: Not on file  Intimate Partner Violence: Not on file    Review of Systems: All negative except as stated above in HPI.  Physical Exam: Vital signs: Vitals:   06/04/24 0903  BP: (!) 148/67  Pulse: 74  Resp: 20  Temp: 98.2 F (36.8 C)  SpO2: 99%     General:   Alert,  thin, pleasant and cooperative in NAD Head: normocephalic, atraumatic Eyes: anicteric sclera ENT: oropharynx clear Neck: supple, nontender Lungs:  Clear  throughout to auscultation.   No wheezes, crackles, or rhonchi. No acute distress. Heart:  Regular rate and rhythm; no murmurs, clicks, rubs,  or gallops. Abdomen: soft, nontender, nondistended, +BS  Rectal:  Deferred Ext: no edema  GI:  Lab Results: No results for input(s): WBC, HGB, HCT, PLT in the last 72 hours. BMET No results for input(s): NA, K, CL, CO2, GLUCOSE, BUN, CREATININE, CALCIUM in the last 72 hours. LFT No results for input(s): PROT, ALBUMIN, AST, ALT, ALKPHOS, BILITOT, BILIDIR, IBILI in the last 72 hours. PT/INR No results for input(s): LABPROT, INR in the last 72 hours.   Studies/Results: No results found.  Impression/Plan: Adenomatous polyps as stated above in need of repeat today to do APC of ICV if residual polyp noted. Propofol  sedation by anesthesia. Informed consent obtained.    LOS: 0 days   [name]  06/04/2024, 10:03 AM  Questions please call [phone]

## 2024-06-05 ENCOUNTER — Encounter (HOSPITAL_COMMUNITY): Payer: Self-pay | Admitting: Gastroenterology

## 2024-06-06 DIAGNOSIS — J449 Chronic obstructive pulmonary disease, unspecified: Secondary | ICD-10-CM | POA: Diagnosis not present

## 2024-06-06 DIAGNOSIS — F319 Bipolar disorder, unspecified: Secondary | ICD-10-CM | POA: Diagnosis not present

## 2024-06-06 LAB — SURGICAL PATHOLOGY

## 2024-06-06 NOTE — Anesthesia Postprocedure Evaluation (Signed)
 Anesthesia Post Note  Patient: Brandi Vazquez  Procedure(s) Performed: COLONOSCOPY     Patient location during evaluation: PACU Anesthesia Type: MAC Level of consciousness: awake and alert Pain management: pain level controlled Vital Signs Assessment: post-procedure vital signs reviewed and stable Respiratory status: spontaneous breathing, nonlabored ventilation, respiratory function stable and patient connected to nasal cannula oxygen Cardiovascular status: stable and blood pressure returned to baseline Postop Assessment: no apparent nausea or vomiting Anesthetic complications: no   No notable events documented.  Last Vitals:  Vitals:   06/04/24 1054 06/04/24 1100  BP: (!) 105/57 (!) 137/40  Pulse: 75 72  Resp: 18 19  Temp:    SpO2: 100% 100%    Last Pain:  Vitals:   06/04/24 1100  TempSrc:   PainSc: 0-No pain   Pain Goal:                   [name]

## 2024-07-07 DIAGNOSIS — F319 Bipolar disorder, unspecified: Secondary | ICD-10-CM | POA: Diagnosis not present

## 2024-07-07 DIAGNOSIS — J449 Chronic obstructive pulmonary disease, unspecified: Secondary | ICD-10-CM | POA: Diagnosis not present

## 2024-07-11 DIAGNOSIS — F419 Anxiety disorder, unspecified: Secondary | ICD-10-CM | POA: Diagnosis not present

## 2024-07-11 DIAGNOSIS — F3178 Bipolar disorder, in full remission, most recent episode mixed: Secondary | ICD-10-CM | POA: Diagnosis not present

## 2024-08-06 DIAGNOSIS — F319 Bipolar disorder, unspecified: Secondary | ICD-10-CM | POA: Diagnosis not present

## 2024-08-06 DIAGNOSIS — J449 Chronic obstructive pulmonary disease, unspecified: Secondary | ICD-10-CM | POA: Diagnosis not present

## 2024-09-06 DIAGNOSIS — J449 Chronic obstructive pulmonary disease, unspecified: Secondary | ICD-10-CM | POA: Diagnosis not present

## 2024-09-06 DIAGNOSIS — F319 Bipolar disorder, unspecified: Secondary | ICD-10-CM | POA: Diagnosis not present
